# Patient Record
Sex: Female | Born: 1985 | Race: White | Hispanic: No | Marital: Married | Smoking: Never smoker
Health system: Southern US, Community
[De-identification: ages and names within clinical notes are randomized; demographics above are authoritative.]

## PROBLEM LIST (undated history)

## (undated) DIAGNOSIS — F419 Anxiety disorder, unspecified: Secondary | ICD-10-CM

## (undated) HISTORY — PX: WISDOM TOOTH EXTRACTION: SHX21

## (undated) HISTORY — DX: Anxiety disorder, unspecified: F41.9

---
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
## 2004-01-06 ENCOUNTER — Other Ambulatory Visit: Admission: RE | Admit: 2004-01-06 | Discharge: 2004-01-06 | Payer: Self-pay

## 2005-05-16 ENCOUNTER — Ambulatory Visit: Payer: Self-pay | Admitting: Family Medicine

## 2005-06-28 ENCOUNTER — Ambulatory Visit: Payer: Self-pay | Admitting: Family Medicine

## 2005-07-26 ENCOUNTER — Other Ambulatory Visit: Admission: RE | Admit: 2005-07-26 | Discharge: 2005-07-26 | Payer: Self-pay | Admitting: Obstetrics and Gynecology

## 2005-10-14 ENCOUNTER — Ambulatory Visit (HOSPITAL_COMMUNITY): Admission: RE | Admit: 2005-10-14 | Discharge: 2005-10-14 | Payer: Self-pay | Admitting: Obstetrics and Gynecology

## 2006-01-20 ENCOUNTER — Ambulatory Visit: Payer: Self-pay | Admitting: Family Medicine

## 2006-03-06 ENCOUNTER — Inpatient Hospital Stay (HOSPITAL_COMMUNITY): Admission: AD | Admit: 2006-03-06 | Discharge: 2006-03-10 | Payer: Self-pay | Admitting: Obstetrics and Gynecology

## 2006-12-20 IMAGING — US US OB COMP +14 WK
1 series · 13 of 28 positions shown · non-contrast
Comparison: none

CLINICAL DATA: Evaluate anatomy.

[Series 1: us ob comp +14 wk · 0.37mm/px · 13 of 57 slices shown]
[im 3/57]
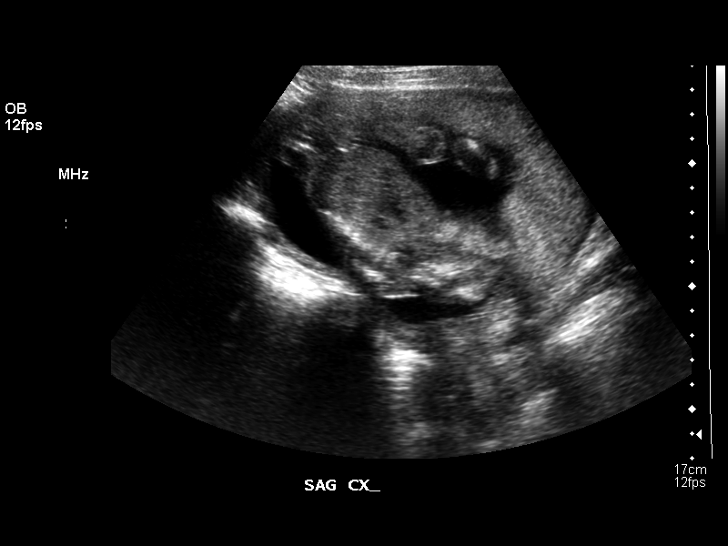
[im 7/57]
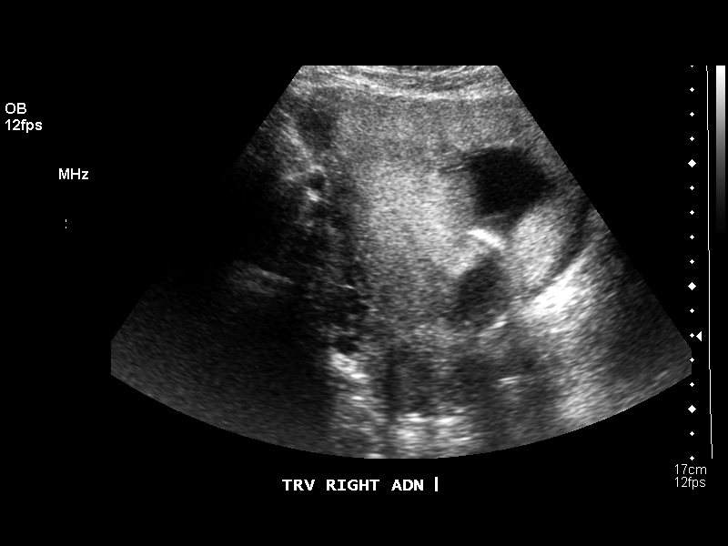
[im 11/57]
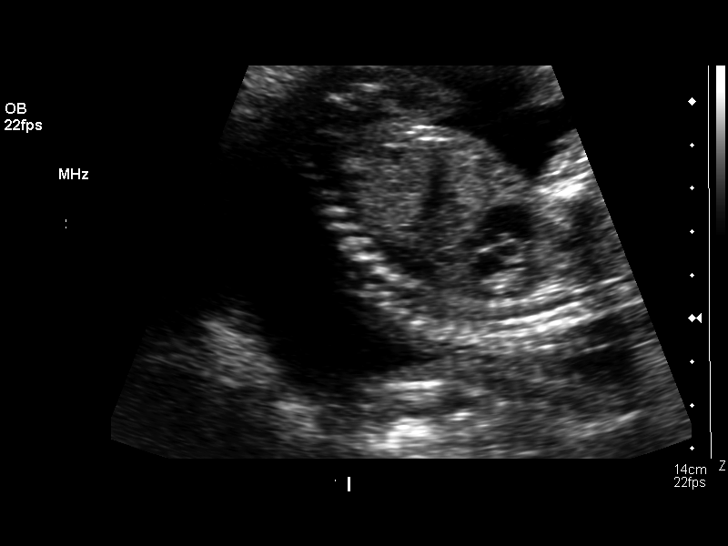
[im 15/57]
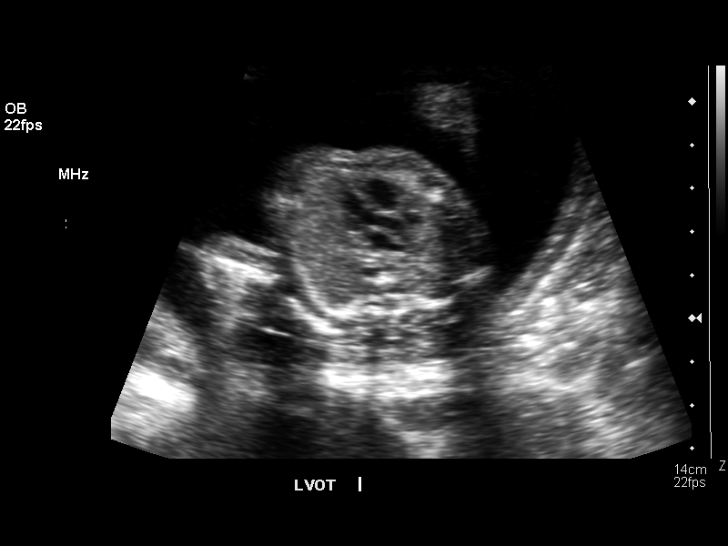
[im 19/57]
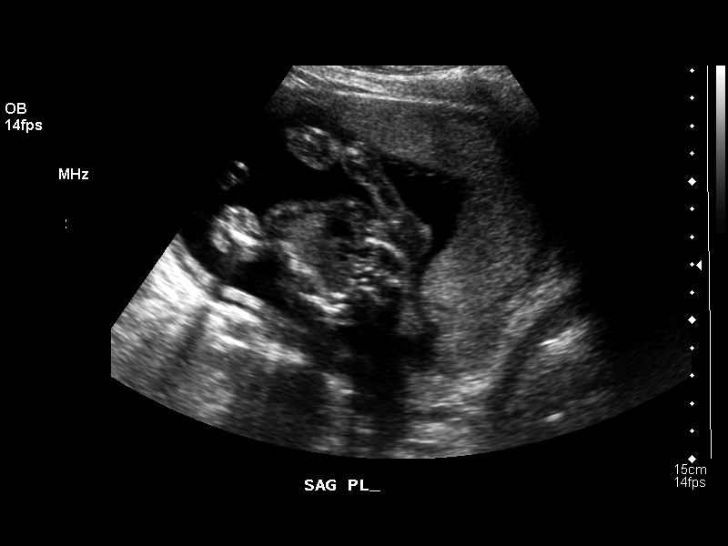
[im 23/57]
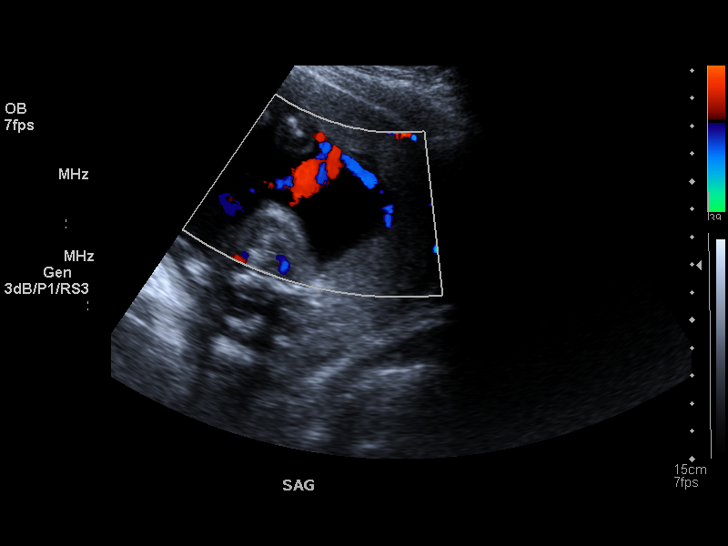
[im 30/57]
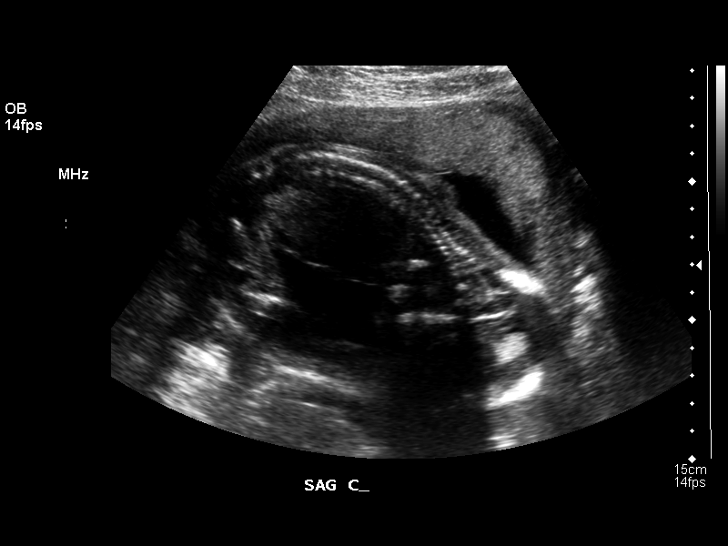
[im 34/57]
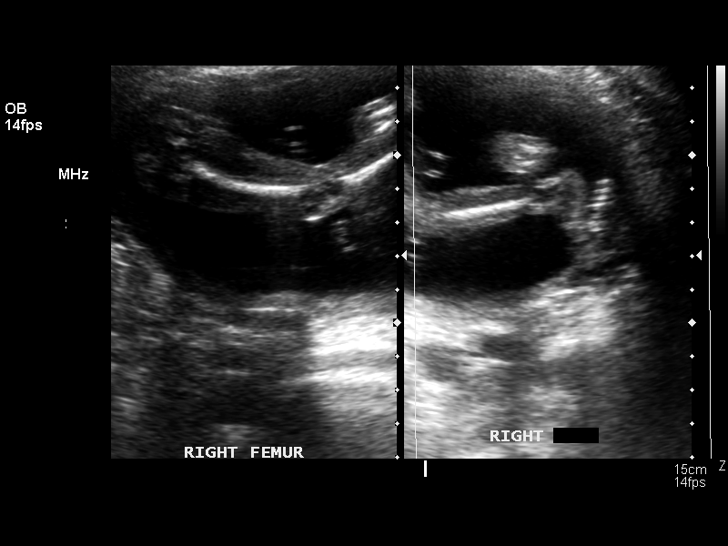
[im 38/57]
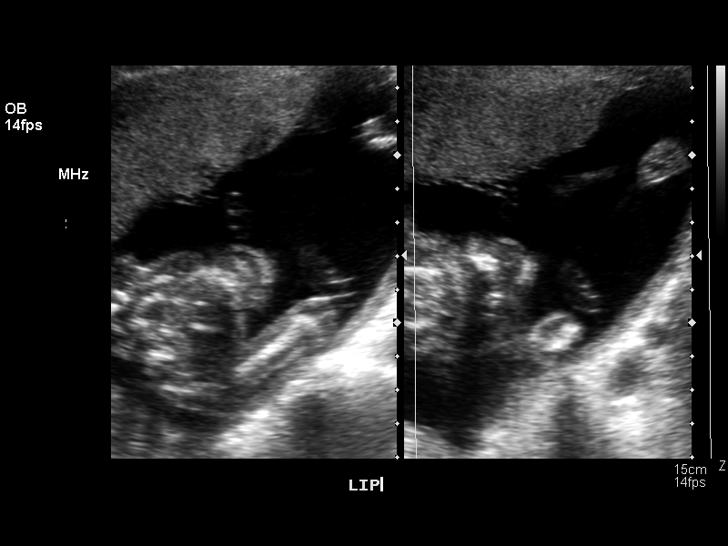
[im 42/57]
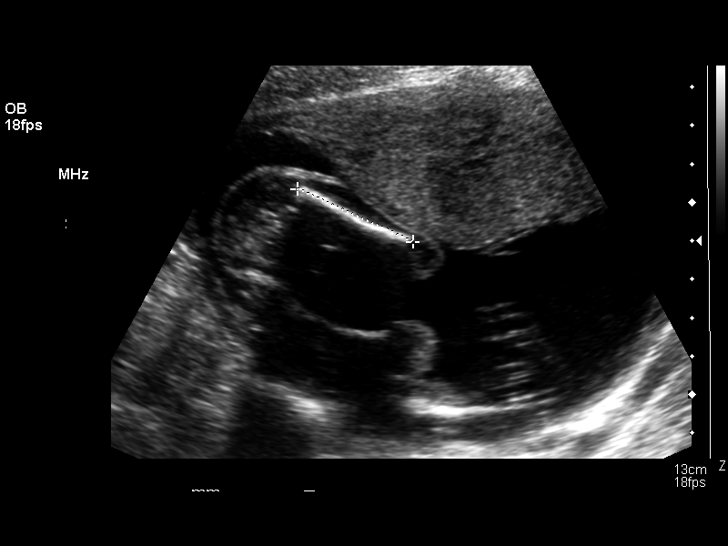
[im 46/57]
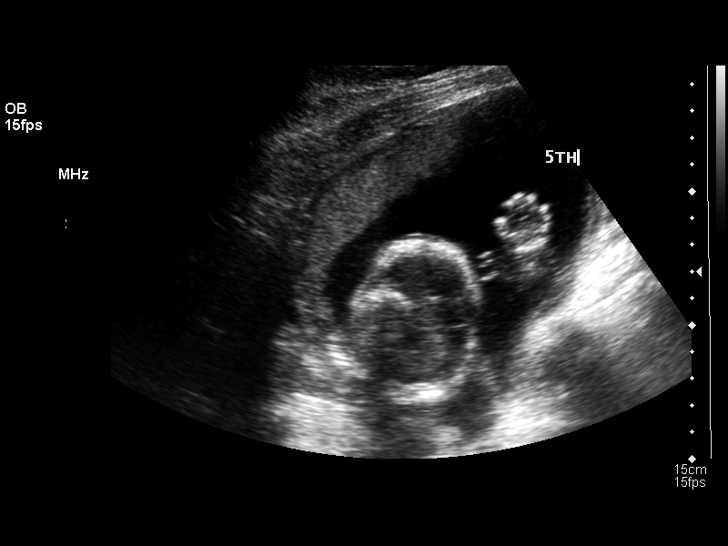
[im 50/57]
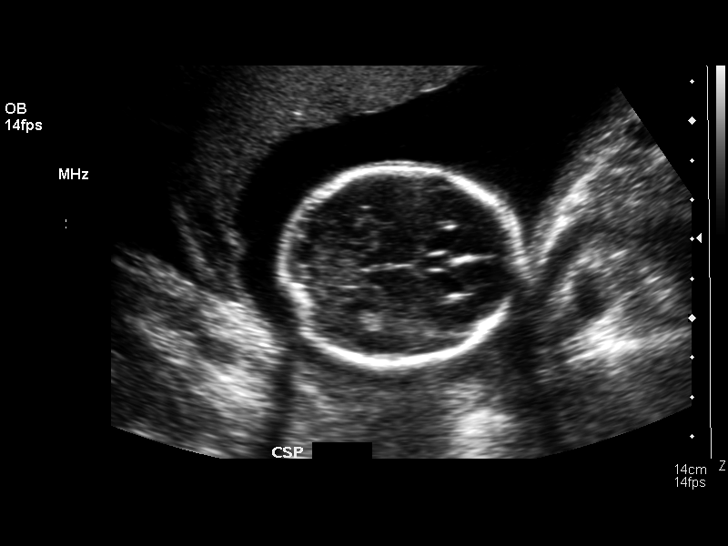
[im 54/57]
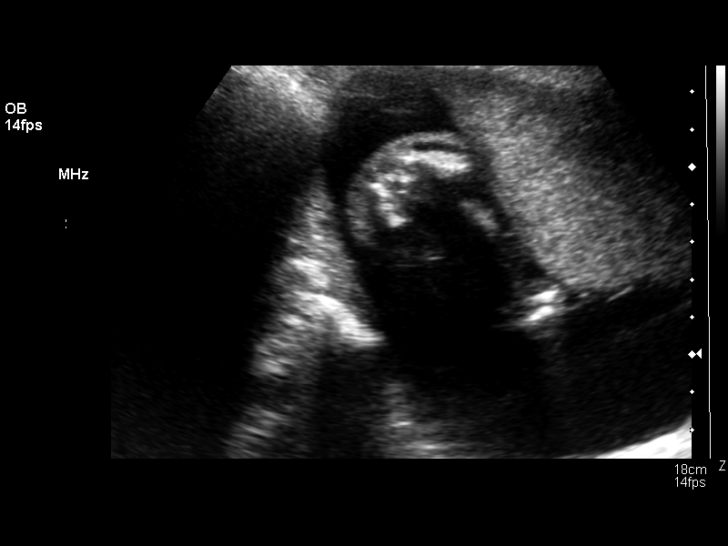

[13 of 28 positions shown; findings below may reference images not displayed]

OBSTETRICAL ULTRASOUND:
 Number of Fetuses:  1
 Heart Rate:  144
 Movement:  Yes
 Breathing:  No  
 Presentation:  Cephalic
 Placental Location:  Anterior
 Grade:  0
 Previa:  No
 Amniotic Fluid (Subjective):  Normal
 Amniotic Fluid (Objective):   4.5 cm Vertical pocket 

 FETAL BIOMETRY
 BPD:  4.8 cm   20 w 6 d   
 HC:   18.5 cm  20 w 6 d
 AC:   15.2 cm   20 w 3 d
 FL:    3.2 cm   20 w 1 d

 MEAN GA:  20 w 4 d
 Assigned GA:  20 w 1 d  Assigned EDC:  03/02/06    

 FETAL ANATOMY
 Lateral Ventricles:    Visualized 
 Thalami/CSP:      Visualized 
 Posterior Fossa:  Visualized 
 Nuchal Region:    Visualized 
 Spine:      Visualized 
 4 Chamber Heart on Left:      Visualized 
 Stomach on Left:      Visualized 
 3 Vessel Cord:    Visualized 
 Cord Insertion site:    Visualized 
 Kidneys:  Visualized 
 Bladder:  Visualized 
 Extremities:      Visualized 

 ADDITIONAL ANATOMY VISUALIZED:  LVOT, RVOT, upper lip, orbits, profile, diaphragm, heel, 5th digit, ductal arch, aortic arch, and male genitalia.

 MATERNAL UTERINE AND ADNEXAL FINDINGS
 Cervix: 3.3 cm Transabdominally.  Normal ovaries.
IMPRESSION: 1.  Single living intrauterine gestation at 20 week 4 day estimated gestational age which correlates well with the reported assigned gestational age.
 2.  Visualized fetal anatomy is unremarkable.

## 2016-01-18 ENCOUNTER — Encounter: Payer: Self-pay | Admitting: Physician Assistant

## 2016-12-19 ENCOUNTER — Encounter (INDEPENDENT_AMBULATORY_CARE_PROVIDER_SITE_OTHER): Payer: Self-pay

## 2016-12-19 ENCOUNTER — Encounter: Payer: Self-pay | Admitting: Physician Assistant

## 2016-12-19 ENCOUNTER — Ambulatory Visit (INDEPENDENT_AMBULATORY_CARE_PROVIDER_SITE_OTHER): Payer: PRIVATE HEALTH INSURANCE | Admitting: Physician Assistant

## 2016-12-19 VITALS — BP 109/75 | HR 73 | Temp 97.4°F | Ht 68.0 in | Wt 251.4 lb

## 2016-12-19 DIAGNOSIS — G43809 Other migraine, not intractable, without status migrainosus: Secondary | ICD-10-CM | POA: Diagnosis not present

## 2016-12-19 DIAGNOSIS — Z3041 Encounter for surveillance of contraceptive pills: Secondary | ICD-10-CM

## 2016-12-19 DIAGNOSIS — F325 Major depressive disorder, single episode, in full remission: Secondary | ICD-10-CM | POA: Diagnosis not present

## 2016-12-19 DIAGNOSIS — G43909 Migraine, unspecified, not intractable, without status migrainosus: Secondary | ICD-10-CM | POA: Insufficient documentation

## 2016-12-19 DIAGNOSIS — IMO0001 Reserved for inherently not codable concepts without codable children: Secondary | ICD-10-CM | POA: Insufficient documentation

## 2016-12-19 DIAGNOSIS — G5702 Lesion of sciatic nerve, left lower limb: Secondary | ICD-10-CM | POA: Insufficient documentation

## 2016-12-19 MED ORDER — SERTRALINE HCL 50 MG PO TABS: 50.0000 mg | ORAL_TABLET | Freq: Every day | ORAL | 11 refills | Status: DC

## 2016-12-19 MED ORDER — SUMATRIPTAN SUCCINATE 100 MG PO TABS: 100.0000 mg | ORAL_TABLET | ORAL | 2 refills | Status: DC | PRN

## 2016-12-19 MED ORDER — DESOGESTREL-ETHINYL ESTRADIOL 0.15-0.02/0.01 MG (21/5) PO TABS: 1.0000 | ORAL_TABLET | Freq: Every day | ORAL | 6 refills | Status: DC

## 2016-12-19 NOTE — Patient Instructions (Signed)
Piriformis Syndrome  Piriformis syndrome is a condition that can cause pain and numbness in your buttocks and down the back of your leg. Piriformis syndrome happens when the small muscle that connects the base of your spine to your hip (piriformis muscle) presses on the nerve that runs down the back of your leg (sciatic nerve).  The piriformis muscle helps your hip rotate and helps to bring your leg back and out. It also helps shift your weight while you are walking to keep you stable. The sciatic nerve runs under or through the piriformis. Damage to the piriformis muscle can cause spasms that put pressure on the nerve below. This causes pain and discomfort while sitting and moving. The pain may feel as if it begins in the buttock and spreads (radiates) down your hip and thigh.  What are the causes?  This condition is caused by pressure on the sciatic nerve from the piriformis muscle. The piriformis muscle can get irritated with overuse, especially if other hip muscles are weak and the piriformis has to do extra work. Piriformis syndrome can also occur after an injury, like a fall onto your buttocks.  What increases the risk?  This condition is more likely to develop in:  · Women.  · People who sit for long periods of time.  · Cyclists.  · People who have weak buttocks muscles (gluteal muscles).    What are the signs or symptoms?  Pain, tingling, or numbness that starts in the buttock and runs down the back of your leg (sciatica) is the most common symptom of this condition. Your symptoms may:  · Get worse the longer you sit.  · Get worse when you walk, run, or go up on stairs.    How is this diagnosed?  This condition is diagnosed based on your symptoms, medical history, and physical exam. During this exam, your health care provider may move your leg into different positions to check for pain. He or she will also press on the muscles of your hip and buttock to see if that increases your symptoms. You may also have  an X-ray or MRI.  How is this treated?  Treatment for this condition may include:  · Stopping all activities that cause pain or make your condition worse.  · Using heat or ice to relieve pain as told by your health care provider.  · Taking medicines to reduce pain and swelling.  · Taking a muscle relaxer to release the piriformis muscle.  · Doing range-of-motion and strengthening exercises (physical therapy) as told by your health care provider.  · Massaging the affected area.  · Getting an injection of an anti-inflammatory medicine or muscle relaxer to reduce inflammation and muscle tension.    In rare cases, you may need surgery to cut the muscle and release pressure on the nerve if other treatments do not work.  Follow these instructions at home:  · Take over-the-counter and prescription medicines only as told by your health care provider.  · Do not sit for long periods. Get up and walk around every 20 minutes or as often as told by your health care provider.  · If directed, apply heat to the affected area as often as told by your health care provider. Use the heat source that your health care provider recommends, such as a moist heat pack or a heating pad.  ? Place a towel between your skin and the heat source.  ? Leave the heat on for 20-30 minutes.  ?   Remove the heat if your skin turns bright red. This is especially important if you are unable to feel pain, heat, or cold. You may have a greater risk of getting burned.  · If directed, apply ice to the injured area.  ? Put ice in a plastic bag.  ? Place a towel between your skin and the bag.  ? Leave the ice on for 20 minutes, 2-3 times a day.  · Do exercises as told by your health care provider.  · Return to your normal activities as told by your health care provider. Ask your health care provider what activities are safe for you.  · Keep all follow-up visits as told by your health care provider. This is important.  How is this prevented?  · Do not sit for  longer than 20 minutes at a time. When you sit, choose padded surfaces.  · Warm up and stretch before being active.  · Cool down and stretch after being active.  · Give your body time to rest between periods of activity.  · Make sure to use equipment that fits you.  · Maintain physical fitness, including:  ? Strength.  ? Flexibility.  Contact a health care provider if:  · Your pain and stiffness continue or get worse.  · Your leg or hip becomes weak.  · You have changes in your bowel function or bladder function.  This information is not intended to replace advice given to you by your health care provider. Make sure you discuss any questions you have with your health care provider.  Document Released: 10/17/2005 Document Revised: 06/21/2016 Document Reviewed: 09/29/2015  Elsevier Interactive Patient Education © 2017 Elsevier Inc.

## 2016-12-19 NOTE — Progress Notes (Signed)
BP 109/75   Pulse 73   Temp 97.4 F (36.3 C) (Oral)   Ht 5\' 8"  (1.727 m)   Wt 251 lb 6.4 oz (114 kg)   LMP 12/05/2016   BMI 38.23 kg/m    Subjective:    Patient ID: Michaela Campbell, female    DOB: 06/15/1986, 31 y.o.   MRN: [id number]  Michaela Campbell is a 31 y.o. female presenting on 12/19/2016 for Follow-up  HPI Patient here to be [facility].  This patient is known to me from [facility]. This patient comes in for periodic recheck on medications and conditions including migraines, depression and birth control.   All medications are reviewed today. There are no reports of any problems with the medications. All of the medical conditions are reviewed and updated.  Lab work is reviewed and will be ordered as medically necessary. Still some old complaints of left hip pain. 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   Past Medical History:  Diagnosis Date  . Anxiety    Relevant past medical, surgical, family and social history reviewed and updated as indicated. Interim medical history since our last visit reviewed. Allergies and medications reviewed and updated.   Data reviewed from any sources in EPIC.  Review of Systems  Constitutional: Negative.  Negative for activity change, fatigue and fever.  HENT: Negative.   Eyes: Negative.   Respiratory: Negative.  Negative for cough.   Cardiovascular: Negative.  Negative for chest pain.  Gastrointestinal: Negative.  Negative for abdominal pain.  Endocrine: Negative.   Genitourinary: Negative.  Negative for dysuria.  Musculoskeletal: Positive for arthralgias. Negative for joint swelling.  Skin: Negative.   Neurological: Negative.      Social History   Social History  . Marital status: Married    Spouse name: N/A  . Number of children: N/A  . Years of education: N/A   Occupational History  . Not on  file.   Social History Main Topics  . Smoking status: Never Smoker  . Smokeless tobacco: Never Used  . Alcohol use Yes     Comment: occ  . Drug use: No  . Sexual activity: Not on file   Other Topics Concern  . Not on file   Social History Narrative  . No narrative on file    Past Surgical History:  Procedure Laterality Date  . WISDOM TOOTH EXTRACTION      History reviewed. No pertinent family history.  Allergies as of 12/19/2016   No Known Allergies     Medication List       Accurate as of 12/19/16  7:45 PM. Always use your most recent med list.          desogestrel-ethinyl estradiol 0.15-0.02/0.01 MG (21/5) tablet Commonly known as:  KARIVA,AZURETTE,MIRCETTE Take 1 tablet by mouth daily.   sertraline 50 MG tablet Commonly known as:  ZOLOFT Take 1 tablet (50 mg total) by mouth daily.   SUMAtriptan 100 MG tablet Commonly known as:  IMITREX Take 1 tablet (100 mg total) by mouth every 2 (two) hours as needed for migraine. May repeat in 2 hours if headache persists or recurs.          Objective:    BP 109/75   Pulse 73   Temp 97.4 F (36.3 C) (Oral)   Ht 5\' 8"  (1.727 m)   Wt 251  lb 6.4 oz (114 kg)   LMP 12/05/2016   BMI 38.23 kg/m   No Known Allergies Wt Readings from Last 3 Encounters:  12/19/16 251 lb 6.4 oz (114 kg)    Physical Exam  Constitutional: She is oriented to person, place, and time. She appears well-developed and well-nourished.  HENT:  Head: Normocephalic and atraumatic.  Right Ear: Tympanic membrane, external ear and ear canal normal.  Left Ear: Tympanic membrane, external ear and ear canal normal.  Nose: Nose normal. No rhinorrhea.  Mouth/Throat: Oropharynx is clear and moist and mucous membranes are normal. No oropharyngeal exudate or posterior oropharyngeal erythema.  Eyes: Conjunctivae and EOM are normal. Pupils are equal, round, and reactive to light.  Neck: Normal range of motion. Neck supple.  Cardiovascular: Normal rate,  regular rhythm, normal heart sounds and intact distal pulses.   Pulmonary/Chest: Effort normal and breath sounds normal.  Abdominal: Soft. Bowel sounds are normal.  Neurological: She is alert and oriented to person, place, and time. She has normal reflexes.  Skin: Skin is warm and dry. No rash noted.  Psychiatric: She has a normal mood and affect. Her behavior is normal. Judgment and thought content normal.    No results found for this or any previous visit.    Assessment & Plan:   1. Piriformis syndrome of left side  2. Other migraine without status migrainosus, not intractable - SUMAtriptan (IMITREX) 100 MG tablet; Take 1 tablet (100 mg total) by mouth every 2 (two) hours as needed for migraine. May repeat in 2 hours if headache persists or recurs.  Dispense: 10 tablet; Refill: 2  3. Encounter for surveillance of contraceptive pills - desogestrel-ethinyl estradiol (KARIVA,AZURETTE,MIRCETTE) 0.15-0.02/0.01 MG (21/5) tablet; Take 1 tablet by mouth daily.  Dispense: 1 Package; Refill: 6  4. Depression, major, single episode, complete remission (HCC) - sertraline (ZOLOFT) 50 MG tablet; Take 1 tablet (50 mg total) by mouth daily.  Dispense: 30 tablet; Refill: 11   Continue all other maintenance medications as listed above. Educational handout given for piriformis stretches  Follow up plan: Return in about 6 months (around 06/18/2017) for CPE/PAP.  Remus Loffler PA-C [facility] [address]  [place], [place] [address] [phone]   12/19/2016, 7:45 PM

## 2017-04-06 ENCOUNTER — Encounter: Payer: Self-pay | Admitting: Physician Assistant

## 2017-04-06 ENCOUNTER — Ambulatory Visit (INDEPENDENT_AMBULATORY_CARE_PROVIDER_SITE_OTHER): Payer: PRIVATE HEALTH INSURANCE | Admitting: Physician Assistant

## 2017-04-06 VITALS — BP 116/76 | HR 75 | Temp 97.3°F | Ht 68.0 in | Wt 260.0 lb

## 2017-04-06 DIAGNOSIS — M25561 Pain in right knee: Secondary | ICD-10-CM

## 2017-04-06 MED ORDER — MELOXICAM 15 MG PO TABS: 15.0000 mg | ORAL_TABLET | Freq: Every day | ORAL | 0 refills | Status: DC

## 2017-04-06 NOTE — Progress Notes (Signed)
Subjective:     Patient ID: Michaela Campbell, female   DOB: [birth year], 31 y.o.   MRN: [id number]  HPI Pt with 1 week hx of pain and swelling to the R knee Hx of same in the past and [facility] Denies any locking or giving way of the knee Using 2 OTC NSAIDS per day Sx worse going up and down steps/hills Review of Systems  Constitutional: Positive for activity change.  Musculoskeletal: Positive for joint swelling.       Objective:   Physical Exam NAD + effusion of the R knee FROM of the knee w/o crepitus No laxity noted + TTP of the lateral joint Lachman neg McMurray neg Good strength    Assessment:     Acute pain of right knee      Plan:     Heat/Ice Mobic 15mg  q d x 10 days OTC brace Hold any OTC NSAIDS F/U if sx continue

## 2017-04-06 NOTE — Patient Instructions (Addendum)
Knee Pain, Adult Knee pain in adults is common. It can be caused by many things, including: Arthritis. A fluid-filled sac (cyst) or growth in your knee. An infection in your knee. An injury that will not heal. Damage, swelling, or irritation of the tissues that support your knee.  Knee pain is usually not a sign of a serious problem. The pain may go away on its own with time and rest. If it does not, a health care provider may order tests to find the cause of the pain. These may include: Imaging tests, such as an X-ray, MRI, or ultrasound. Joint aspiration. In this test, fluid is removed from the knee. Arthroscopy. In this test, a lighted tube is inserted into knee and an image is projected onto a TV screen. A biopsy. In this test, a sample of tissue is removed from the body and studied under a microscope.  Follow these instructions at home: Pay attention to any changes in your symptoms. Take these actions to relieve your pain. Activity Rest your knee. Do not do things that cause pain or make pain worse. Avoid high-impact activities or exercises, such as running, jumping rope, or doing jumping jacks. General instructions Take over-the-counter and prescription medicines only as told by your health care provider. Raise (elevate) your knee above the level of your heart when you are sitting or lying down. Sleep with a pillow under your knee. If directed, apply ice to the knee: Put ice in a plastic bag. Place a towel between your skin and the bag. Leave the ice on for 20 minutes, 2-3 times a day. Ask your health care provider if you should wear an elastic knee support. Lose weight if you are overweight. Extra weight can put pressure on your knee. Do not use any products that contain nicotine or tobacco, such as cigarettes and e-cigarettes. Smoking may slow the healing of any bone and joint problems that you may have. If you need help quitting, ask your health care provider. Contact a health  care provider if: Your knee pain continues, changes, or gets worse. You have a fever along with knee pain. Your knee buckles or locks up. Your knee swells, and the swelling becomes worse. Get help right away if: Your knee feels warm to the touch. You cannot move your knee. You have severe pain in your knee. You have chest pain. You have trouble breathing. Summary Knee pain in adults is common. It can be caused by many things, including, arthritis, infection, cysts, or injury. Knee pain is usually not a sign of a serious problem, but if it does not go away, a health care provider may perform tests to know the cause of the pain. Pay attention to any changes in your symptoms. Relieve your pain with rest, medicines, light activity, and use of ice. Get help if your pain continues or becomes very severe, or if your knee buckles or locks up, or if you have chest pain or trouble breathing. This information is not intended to replace advice given to you by your health care provider. Make sure you discuss any questions you have with your health care provider. Document Released: 08/14/2007 Document Revised: 10/07/2016 Document Reviewed: 10/07/2016 Elsevier Interactive Patient Education  2018 Elsevier Inc. Knee Pain, Adult Many things can cause knee pain. The pain often goes away on its own with time and rest. If the pain does not go away, tests may be done to find out what is causing the pain. Follow these instructions at  home: Activity Rest your knee. Do not do things that cause pain. Avoid activities where both feet leave the ground at the same time (high-impact activities). Examples are running, jumping rope, and doing jumping jacks. General instructions Take medicines only as told by your doctor. Raise (elevate) your knee when you are resting. Make sure your knee is higher than your heart. Sleep with a pillow under your knee. If told, put ice on the knee: Put ice in a plastic bag. Place a  towel between your skin and the bag. Leave the ice on for 20 minutes, 2-3 times a day. Ask your doctor if you should wear an elastic knee support. Lose weight if you are overweight. Being overweight can make your knee hurt more. Do not use any tobacco products. These include cigarettes, chewing tobacco, or electronic cigarettes. If you need help quitting, ask your doctor. Smoking may slow down healing. Contact a doctor if: The pain does not stop. The pain changes or gets worse. You have a fever along with knee pain. Your knee gives out or locks up. Your knee swells, and becomes worse. Get help right away if: Your knee feels warm. You cannot move your knee. You have very bad knee pain. You have chest pain. You have trouble breathing. Summary Many things can cause knee pain. The pain often goes away on its own with time and rest. Avoid activities that put stress on your knee. These include running and jumping rope. Get help right away if you cannot move your knee, or if your knee feels warm, or if you have trouble breathing. This information is not intended to replace advice given to you by your health care provider. Make sure you discuss any questions you have with your health care provider. Document Released: 01/13/2009 Document Revised: 10/11/2016 Document Reviewed: 10/11/2016 Elsevier Interactive Patient Education  2017 Elsevier Inc. Hoffa Disease Hoffa disease is a condition that causes pain in front of the knee, below the kneecap. The pain is caused by pinching (impingement) ofa swollen or scarred mass of fatty tissue (fat pad) below your kneecap. There are two kinds of Hoffa disease:  Acute Hoffa disease. This kind develops due to a recent injury.  Chronic Hoffa disease. This kind develops over time.  Hoffa disease is also called infrapatellar fat pad impingement. What are the causes? Acute Hoffa disease is caused by an injury to the fat pad below your kneecap. It can happen  if:  You are hit under your kneecap.  You fall on your knee.  You forcefully straighten your knee more than normal (hyperextend your knee).  Chronic Hoffa disease can be caused by repeated minor injuries, such as:  Repeatedly kneeling or falling on a hard surface.  Repeatedly hyperextending your knee.  What increases the risk? This condition is more likely to develop in people who play:  Contact sports, such as hockey and football.  Sports that involve forceful extending of the knee, such as football and soccer.  What are the signs or symptoms? The main symptom of this condition is pain in the front of your knee. The pain may:  Be burning or aching.  Get worse when you straighten your knee, squat, or stand for long periods of time.  Be felt when you touch or move the kneecap (patella).  There may also be swelling below the patella. How is this diagnosed? This condition may be diagnosed with:  Your medical history.  A physical exam. The exam may include straightening your knee  with pressure near the fat pad and moving your knee in different directions.  MRI. This may be done to look for swelling or changes to the fat pad.  How is this treated? This condition may be treated by:  Resting your knee until swelling and pain improve.  Icing your knee.  Taking over-the-counter pain medicine to reduce pain and swelling.  Wrapping or taping your knee.  Injecting the fat pad with numbing and anti-inflammatory medicine (steroid).  Doing knee exercises.  Avoiding putting direct pressure on your knees, such as by kneeling.  Staying aware of the way you walk and stand so your knee does not hyperextend.  If these treatments are not helpful, you may need to have surgery. Surgery usually includes taking out the part of the fat pad that is causing pain. Follow these instructions at home: If your knee is wrapped or taped:  Wear the wrap or tape as told by your health care  provider.  Loosen the wrap or tape if your toes tingle, become numb, or turn cold and blue.  Remove the wrap or tape only as told by your health care provider. Managing pain, stiffness, and swelling  If directed, apply ice to your knee. ? Put ice in a plastic bag. ? Place a towel between your skin and the bag. ? Leave the ice on for 20 minutes, 2-3 times a day.  Move your toes often to avoid stiffness and to lessen swelling.  Raise (elevate) your knee above the level of your heart while you are sitting or lying down. Activity  Return to your normal activities as told by your health care provider. Ask your health care provider what activities are safe for you.  Avoiding putting direct pressure on your knees, such as by kneeling, for as long as told by your health care provider.  Do exercises as told by your health care provider. General instructions  Take over-the-counter and prescription medicines only as told by your health care provider.  Keep all follow-up visits as told by your health care provider. This is important. How is this prevented?  Warm up and stretch before being active.  Cool down and stretch after being active.  Give your body time to rest between periods of activity.  Make sure to use equipment that fits you.  When playing contact sports, wear pads that protect your knees.  Be safe and responsible while being active to avoid falls.  Do at least 150 minutes of moderate-intensity exercise each week, such as brisk walking or water aerobics.  Maintain physical fitness, including: ? Strength. ? Flexibility. ? Cardiovascular fitness. ? Endurance. Contact a health care provider if:  Your knee pain or swelling is not improving after 2 weeks of home treatment.  Your knee pain or swelling is getting worse. This information is not intended to replace advice given to you by your health care provider. Make sure you discuss any questions you have with your  health care provider. Document Released: 10/17/2005 Document Revised: 06/23/2016 Document Reviewed: 08/29/2015 Elsevier Interactive Patient Education  2018 [employer]

## 2017-06-19 ENCOUNTER — Other Ambulatory Visit: Payer: PRIVATE HEALTH INSURANCE | Admitting: Physician Assistant

## 2017-06-27 ENCOUNTER — Ambulatory Visit (INDEPENDENT_AMBULATORY_CARE_PROVIDER_SITE_OTHER): Payer: PRIVATE HEALTH INSURANCE | Admitting: Physician Assistant

## 2017-06-27 ENCOUNTER — Encounter: Payer: Self-pay | Admitting: Physician Assistant

## 2017-06-27 VITALS — BP 121/64 | HR 85 | Temp 98.3°F | Ht 68.0 in | Wt 274.0 lb

## 2017-06-27 DIAGNOSIS — Z3041 Encounter for surveillance of contraceptive pills: Secondary | ICD-10-CM

## 2017-06-27 DIAGNOSIS — Z01419 Encounter for gynecological examination (general) (routine) without abnormal findings: Secondary | ICD-10-CM | POA: Diagnosis not present

## 2017-06-27 DIAGNOSIS — R635 Abnormal weight gain: Secondary | ICD-10-CM

## 2017-06-27 LAB — URINALYSIS, COMPLETE
Bilirubin, UA: NEGATIVE
Glucose, UA: NEGATIVE
LEUKOCYTES UA: NEGATIVE
Nitrite, UA: NEGATIVE
PROTEIN UA: NEGATIVE
SPEC GRAV UA: 1.025 (ref 1.005–1.030)
Urobilinogen, Ur: 0.2 mg/dL (ref 0.2–1.0)
pH, UA: 7 (ref 5.0–7.5)

## 2017-06-27 LAB — MICROSCOPIC EXAMINATION
Renal Epithel, UA: NONE SEEN /hpf
WBC, UA: NONE SEEN /hpf (ref 0–?)

## 2017-06-27 MED ORDER — DESOGESTREL-ETHINYL ESTRADIOL 0.15-0.02/0.01 MG (21/5) PO TABS: 1.0000 | ORAL_TABLET | Freq: Every day | ORAL | 12 refills | Status: DC

## 2017-06-27 MED ORDER — PHENTERMINE HCL 37.5 MG PO CAPS: 37.5000 mg | ORAL_CAPSULE | ORAL | 1 refills | Status: DC

## 2017-06-27 NOTE — Patient Instructions (Signed)
Breakfast: eggs 2-3 Or greek yogurt low fat DANNON 1 slice delightful [employer] bread  Lunch: 2 slice [employer] delightfully bread       Or Nature's Own Light 4 ounces chicken, turkey, roast beef 1 slice Thin sliced cheese Sargento Mustard ok 1 piece of fruit  Supper: 6 ounces lean meat 2 cups raw/cooked veg or 1 cup pintos, corn lima  3 snacks 100 calories or less  

## 2017-06-27 NOTE — Progress Notes (Signed)
BP 121/64   Pulse 85   Temp 98.3 F (36.8 C) (Oral)   Ht 5\' 8"  (1.727 m)   Wt 274 lb (124.3 kg)   BMI 41.66 kg/m    Subjective:    Patient ID: Michaela Campbell, female    DOB: [birth year], 31 y.o.   MRN: [id number]  HPI: Michaela Campbell is a 31 y.o. female presenting on 06/27/2017 for Annual Exam  This patient comes in for annual well physical examination. All medications are reviewed today. There are no reports of any problems with the medications. All of the medical conditions are reviewed and updated.  Lab work is reviewed and will be ordered as medically necessary. There are no new problems reported with today's visit.  Patient reports doing well overall.  The patient does have a more sedentary job now. 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 She has gained about 20 pounds. She is taken phentermine in the past. She would like to start this back. We have talked about a higher protein lower carb type diet around 1500 cal that information has been given to her. And we will recheck her in 2 months.  Relevant past medical, surgical, family and social history reviewed and updated as indicated. Allergies and medications reviewed and updated.  Past Medical History:  Diagnosis Date  . Anxiety     Past Surgical History:  Procedure Laterality Date  . WISDOM TOOTH EXTRACTION      Review of Systems  Constitutional: Positive for unexpected weight change. Negative for activity change, fatigue and fever.  HENT: Negative.   Eyes: Negative.   Respiratory: Negative.  Negative for cough and wheezing.   Cardiovascular: Negative.  Negative for chest pain, palpitations and leg swelling.  Gastrointestinal: Negative.  Negative for abdominal pain.  Endocrine: Negative.   Genitourinary: Negative.  Negative for dysuria.  Musculoskeletal: Negative.   Skin: Negative.   Neurological: Negative.     Allergies as of 06/27/2017   No Known Allergies     Medication List         Accurate as of 06/27/17  4:43 PM. Always use your most recent med list.          desogestrel-ethinyl estradiol 0.15-0.02/0.01 MG (21/5) tablet Commonly known as:  KARIVA,AZURETTE,MIRCETTE Take 1 tablet by mouth daily.   ibuprofen 200 MG tablet Commonly known as:  ADVIL,MOTRIN Take 200 mg by mouth every 6 (six) hours as needed.   phentermine 37.5 MG capsule Take 1 capsule (37.5 mg total) by mouth every morning.   sertraline 50 MG tablet Commonly known as:  ZOLOFT Take 1 tablet (50 mg total) by mouth daily.   SUMAtriptan 100 MG tablet Commonly known as:  IMITREX Take 1 tablet (100 mg total) by mouth every 2 (two) hours as needed for migraine. May repeat in 2 hours if headache persists or recurs.            Discharge Care Instructions        Start     Ordered   06/27/17 0000  phentermine 37.5 MG capsule  BH-each morning    Question:  Supervising Provider  Answer:  Elenora Gamma   06/27/17 1614   06/27/17 0000  desogestrel-ethinyl estradiol (KARIVA,AZURETTE,MIRCETTE) 0.15-0.02/0.01 MG (21/5) tablet  Daily    Question:  Supervising Provider  Answer:  Elenora Gamma   06/27/17 1615   06/27/17 0000  Pap IG w/ reflex to HPV when ASC-U    Question:  Number of Slides/Vials:  Answer:  1   06/27/17 1625   06/27/17 0000  Urinalysis, Complete     06/27/17 1626         Objective:    BP 121/64   Pulse 85   Temp 98.3 F (36.8 C) (Oral)   Ht 5\' 8"  (1.727 m)   Wt 274 lb (124.3 kg)   BMI 41.66 kg/m   No Known Allergies  Physical Exam  Constitutional: She is oriented to person, place, and time. She appears well-developed and well-nourished.  HENT:  Head: Normocephalic and atraumatic.  Eyes: Pupils are equal, round, and reactive to light. Conjunctivae and EOM are normal.  Neck: Normal range of motion. Neck supple.  Cardiovascular: Normal rate, regular rhythm, normal heart sounds and intact distal pulses.   Pulmonary/Chest: Effort normal and breath sounds  normal. Right breast exhibits no mass, no skin change and no tenderness. Left breast exhibits no mass, no skin change and no tenderness. Breasts are symmetrical.  Abdominal: Soft. Bowel sounds are normal.  Genitourinary: Vagina normal and uterus normal. Rectal exam shows no fissure. No breast swelling, tenderness, discharge or bleeding. There is no tenderness or lesion on the right labia. There is no tenderness or lesion on the left labia. Uterus is not deviated, not enlarged and not tender. Cervix exhibits no motion tenderness, no discharge and no friability. Right adnexum displays no mass, no tenderness and no fullness. Left adnexum displays no mass, no tenderness and no fullness. No tenderness or bleeding in the vagina. No vaginal discharge found.  Neurological: She is alert and oriented to person, place, and time. She has normal reflexes.  Skin: Skin is warm and dry. No rash noted.  Psychiatric: She has a normal mood and affect. Her behavior is normal. Judgment and thought content normal.  Nursing note and vitals reviewed.   No results found for this or any previous visit.    Assessment & Plan:   1. Well female exam with routine gynecological exam - Pap IG w/ reflex to HPV when ASC-U - Urinalysis, Complete  2. Encounter for surveillance of contraceptive pills - desogestrel-ethinyl estradiol (KARIVA,AZURETTE,MIRCETTE) 0.15-0.02/0.01 MG (21/5) tablet; Take 1 tablet by mouth daily.  Dispense: 1 Package; Refill: 12  3. Weight gain - phentermine 37.5 MG capsule; Take 1 capsule (37.5 mg total) by mouth every morning.  Dispense: 30 capsule; Refill: 1    Current Outpatient Prescriptions:  .  desogestrel-ethinyl estradiol (KARIVA,AZURETTE,MIRCETTE) 0.15-0.02/0.01 MG (21/5) tablet, Take 1 tablet by mouth daily., Disp: 1 Package, Rfl: 12 .  ibuprofen (ADVIL,MOTRIN) 200 MG tablet, Take 200 mg by mouth every 6 (six) hours as needed., Disp: , Rfl:  .  sertraline (ZOLOFT) 50 MG tablet, Take 1 tablet  (50 mg total) by mouth daily., Disp: 30 tablet, Rfl: 11 .  SUMAtriptan (IMITREX) 100 MG tablet, Take 1 tablet (100 mg total) by mouth every 2 (two) hours as needed for migraine. May repeat in 2 hours if headache persists or recurs., Disp: 10 tablet, Rfl: 2 .  phentermine 37.5 MG capsule, Take 1 capsule (37.5 mg total) by mouth every morning., Disp: 30 capsule, Rfl: 1 Continue all other maintenance medications as listed above.  Follow up plan: Return in about 2 months (around 08/27/2017) for recheck weight.  Educational handout given for 1500 calorie diet  Remus Loffler PA-C [facility] [address]  [place], [place] [address] [phone]   06/27/2017, 4:43 PM

## 2017-06-28 LAB — PAP IG W/ RFLX HPV ASCU: PAP SMEAR COMMENT: 0

## 2017-08-28 ENCOUNTER — Encounter: Payer: Self-pay | Admitting: Physician Assistant

## 2017-08-28 ENCOUNTER — Ambulatory Visit (INDEPENDENT_AMBULATORY_CARE_PROVIDER_SITE_OTHER): Payer: PRIVATE HEALTH INSURANCE | Admitting: Physician Assistant

## 2017-08-28 DIAGNOSIS — R635 Abnormal weight gain: Secondary | ICD-10-CM

## 2017-08-28 MED ORDER — PHENTERMINE HCL 37.5 MG PO CAPS: 37.5000 mg | ORAL_CAPSULE | ORAL | 1 refills | Status: DC

## 2017-08-28 NOTE — Patient Instructions (Signed)
In a few days you may receive a survey in the mail or online from [employer] regarding your visit with us today. Please take a moment to fill this out. Your feedback is very important to our whole office. It can help us better understand your needs as well as improve your experience and satisfaction. Thank you for taking your time to complete it. We care about you.  Vang Kraeger, PA-C  

## 2017-08-28 NOTE — Progress Notes (Signed)
BP 108/74   Pulse 68   Temp (!) 97.2 F (36.2 C) (Oral)   Ht 5\' 8"  (1.727 m)   Wt 272 lb (123.4 kg)   BMI 41.36 kg/m    Subjective:    Patient ID: Michaela Campbell, female    DOB: 30-May-1986, 31 y.o.   MRN: [id number]  HPI: Michaela Campbell is a 31 y.o. female presenting on 08/28/2017 for No chief complaint on file. This patient comes in for 47-month recheck on her weight loss program.  She has lost 2 pounds.  She states that she was out of town training for about 3 weeks and had a hard time eating properly.  She does have a very high stress job.  Overall she is feeling good on the Adipex.  She would like to continue with it.  We will plan to recheck her in 2 months.  Relevant past medical, surgical, family and social history reviewed and updated as indicated. Allergies and medications reviewed and updated.  Past Medical History:  Diagnosis Date  . Anxiety     Past Surgical History:  Procedure Laterality Date  . WISDOM TOOTH EXTRACTION      Review of Systems  Constitutional: Negative.   HENT: Negative.   Eyes: Negative.   Respiratory: Negative.   Gastrointestinal: Negative.   Genitourinary: Negative.     Allergies as of 08/28/2017   No Known Allergies     Medication List       Accurate as of 08/28/17 11:10 AM. Always use your most recent med list.          desogestrel-ethinyl estradiol 0.15-0.02/0.01 MG (21/5) tablet Commonly known as:  KARIVA,AZURETTE,MIRCETTE Take 1 tablet by mouth daily.   ibuprofen 200 MG tablet Commonly known as:  ADVIL,MOTRIN Take 200 mg by mouth every 6 (six) hours as needed.   phentermine 37.5 MG capsule Take 1 capsule (37.5 mg total) by mouth every morning.   sertraline 50 MG tablet Commonly known as:  ZOLOFT Take 1 tablet (50 mg total) by mouth daily.   SUMAtriptan 100 MG tablet Commonly known as:  IMITREX Take 1 tablet (100 mg total) by mouth every 2 (two) hours as needed for migraine. May repeat in 2 hours if headache  persists or recurs.          Objective:    BP 108/74   Pulse 68   Temp (!) 97.2 F (36.2 C) (Oral)   Ht 5\' 8"  (1.727 m)   Wt 272 lb (123.4 kg)   BMI 41.36 kg/m   No Known Allergies  Physical Exam  Constitutional: She is oriented to person, place, and time. She appears well-developed and well-nourished.  HENT:  Head: Normocephalic and atraumatic.  Eyes: Pupils are equal, round, and reactive to light. Conjunctivae and EOM are normal.  Cardiovascular: Normal rate, regular rhythm, normal heart sounds and intact distal pulses.   Pulmonary/Chest: Effort normal and breath sounds normal.  Abdominal: Soft. Bowel sounds are normal.  Neurological: She is alert and oriented to person, place, and time. She has normal reflexes.  Skin: Skin is warm and dry. No rash noted.  Psychiatric: She has a normal mood and affect. Her behavior is normal. Judgment and thought content normal.    Results for orders placed or performed in visit on 06/27/17  Microscopic Examination  Result Value Ref Range   WBC, UA None seen 0 - 5 /hpf   RBC, UA 3-10 (A) 0 - 2 /hpf   Epithelial Cells (  non renal) 0-10 0 - 10 /hpf   Renal Epithel, UA None seen None seen /hpf   Bacteria, UA Few None seen/Few  Urinalysis, Complete  Result Value Ref Range   Specific Gravity, UA 1.025 1.005 - 1.030   pH, UA 7.0 5.0 - 7.5   Color, UA Yellow Yellow   Appearance Ur Clear Clear   Leukocytes, UA Negative Negative   Protein, UA Negative Negative/Trace   Glucose, UA Negative Negative   Ketones, UA Trace (A) Negative   RBC, UA 1+ (A) Negative   Bilirubin, UA Negative Negative   Urobilinogen, Ur 0.2 0.2 - 1.0 mg/dL   Nitrite, UA Negative Negative   Microscopic Examination See below:   Pap IG w/ reflex to HPV when ASC-U  Result Value Ref Range   DIAGNOSIS: Comment    Specimen adequacy: Comment    Clinician Provided ICD10 Comment    Performed by: Comment    PAP Smear Comment .    Note: Comment    Test Methodology  Comment    PAP Reflex Comment       Assessment & Plan:   1. Weight gain - phentermine 37.5 MG capsule; Take 1 capsule (37.5 mg total) by mouth every morning.  Dispense: 30 capsule; Refill: 1    Current Outpatient Prescriptions:  .  desogestrel-ethinyl estradiol (KARIVA,AZURETTE,MIRCETTE) 0.15-0.02/0.01 MG (21/5) tablet, Take 1 tablet by mouth daily., Disp: 1 Package, Rfl: 12 .  ibuprofen (ADVIL,MOTRIN) 200 MG tablet, Take 200 mg by mouth every 6 (six) hours as needed., Disp: , Rfl:  .  phentermine 37.5 MG capsule, Take 1 capsule (37.5 mg total) by mouth every morning., Disp: 30 capsule, Rfl: 1 .  sertraline (ZOLOFT) 50 MG tablet, Take 1 tablet (50 mg total) by mouth daily., Disp: 30 tablet, Rfl: 11 .  SUMAtriptan (IMITREX) 100 MG tablet, Take 1 tablet (100 mg total) by mouth every 2 (two) hours as needed for migraine. May repeat in 2 hours if headache persists or recurs., Disp: 10 tablet, Rfl: 2 Continue all other maintenance medications as listed above.  Follow up plan: Return in about 2 months (around 10/28/2017) for recheck.  Educational handout given for survey  Remus LofflerAngel S. Shanah Guimaraes PA-C [facility] [address]  [place], [place] [id number] [phone]   08/28/2017, 11:10 AM

## 2017-11-02 ENCOUNTER — Ambulatory Visit: Payer: PRIVATE HEALTH INSURANCE | Admitting: Physician Assistant

## 2017-12-12 ENCOUNTER — Other Ambulatory Visit: Payer: Self-pay | Admitting: Physician Assistant

## 2017-12-12 DIAGNOSIS — G43809 Other migraine, not intractable, without status migrainosus: Secondary | ICD-10-CM

## 2018-01-11 ENCOUNTER — Other Ambulatory Visit: Payer: Self-pay | Admitting: Physician Assistant

## 2018-01-11 DIAGNOSIS — F325 Major depressive disorder, single episode, in full remission: Secondary | ICD-10-CM

## 2018-03-08 ENCOUNTER — Other Ambulatory Visit: Payer: Self-pay | Admitting: Physician Assistant

## 2018-03-08 DIAGNOSIS — F325 Major depressive disorder, single episode, in full remission: Secondary | ICD-10-CM

## 2018-04-01 ENCOUNTER — Other Ambulatory Visit: Payer: Self-pay | Admitting: Family Medicine

## 2018-04-01 DIAGNOSIS — R635 Abnormal weight gain: Secondary | ICD-10-CM

## 2018-04-02 NOTE — Telephone Encounter (Signed)
Last seen 08-28-17

## 2018-04-05 ENCOUNTER — Telehealth: Payer: Self-pay

## 2018-04-05 NOTE — Telephone Encounter (Signed)
Insurance denied Phentermine

## 2018-04-06 NOTE — Telephone Encounter (Signed)
Will have to pay cash

## 2018-04-26 ENCOUNTER — Telehealth: Payer: Self-pay | Admitting: Physician Assistant

## 2018-04-26 NOTE — Telephone Encounter (Signed)
yes

## 2018-04-27 ENCOUNTER — Ambulatory Visit: Payer: PRIVATE HEALTH INSURANCE | Admitting: Physician Assistant

## 2018-04-27 ENCOUNTER — Encounter: Payer: Self-pay | Admitting: Physician Assistant

## 2018-04-27 VITALS — BP 109/75 | HR 78 | Temp 97.5°F | Ht 68.0 in | Wt 268.8 lb

## 2018-04-27 DIAGNOSIS — R635 Abnormal weight gain: Secondary | ICD-10-CM

## 2018-04-27 DIAGNOSIS — N915 Oligomenorrhea, unspecified: Secondary | ICD-10-CM | POA: Diagnosis not present

## 2018-04-27 MED ORDER — PHENTERMINE HCL 37.5 MG PO CAPS: ORAL_CAPSULE | ORAL | 1 refills | Status: DC

## 2018-04-27 MED ORDER — NORETHINDRONE-ETH ESTRADIOL 0.5-35 MG-MCG PO TABS: 1.0000 | ORAL_TABLET | Freq: Every day | ORAL | 11 refills | Status: DC

## 2018-04-27 MED ORDER — PHENTERMINE HCL 37.5 MG PO CAPS
ORAL_CAPSULE | ORAL | 1 refills | Status: DC
Start: 2018-04-27 — End: 2018-04-27

## 2018-04-27 MED ORDER — MEDROXYPROGESTERONE ACETATE 10 MG PO TABS: 10.0000 mg | ORAL_TABLET | Freq: Every day | ORAL | 0 refills | Status: DC

## 2018-04-27 NOTE — Telephone Encounter (Signed)
Patient has appointment today

## 2018-04-27 NOTE — Progress Notes (Signed)
BP 109/75   Pulse 78   Temp (!) 97.5 F (36.4 C) (Oral)   Ht 5\' 8"  (1.727 m)   Wt 268 lb 12.8 oz (121.9 kg)   BMI 40.87 kg/m    Subjective:    Patient ID: Michaela Campbell, female    DOB: 12/08/1985, 32 y.o.   MRN: [id number]  HPI: Michaela Campbell is a 32 y.o. female presenting on 04/27/2018 for Oligomenorrhea (patient is on cycle now for 3 weeks)    Past Medical History:  Diagnosis Date  . Anxiety    Relevant past medical, surgical, family and social history reviewed and updated as indicated. Interim medical history since our last visit reviewed. Allergies and medications reviewed and updated. DATA REVIEWED: CHART IN EPIC  Family History reviewed for pertinent findings.  Review of Systems  Constitutional: Negative.  Negative for activity change, fatigue and fever.  HENT: Negative.   Eyes: Negative.   Respiratory: Negative.  Negative for cough.   Cardiovascular: Negative.  Negative for chest pain.  Gastrointestinal: Negative.  Negative for abdominal pain.  Endocrine: Negative.   Genitourinary: Positive for menstrual problem and pelvic pain. Negative for dysuria, hematuria, vaginal discharge and vaginal pain.  Musculoskeletal: Negative.   Skin: Negative.   Neurological: Negative.     Allergies as of 04/27/2018   No Known Allergies     Medication List        Accurate as of 04/27/18  2:43 PM. Always use your most recent med list.          ibuprofen 200 MG tablet Commonly known as:  ADVIL,MOTRIN Take 200 mg by mouth every 6 (six) hours as needed.   medroxyPROGESTERone 10 MG tablet Commonly known as:  PROVERA Take 1 tablet (10 mg total) by mouth daily.   norethindrone-ethinyl estradiol 0.5-35 MG-MCG tablet Commonly known as:  NECON 0.5/35 (28) Take 1 tablet by mouth daily.   phentermine 37.5 MG capsule TAKE 1 CAPSULE IN AM   sertraline 50 MG tablet Commonly known as:  ZOLOFT TAKE 1 TABLET (50 MG TOTAL) BY MOUTH DAILY.   SUMAtriptan 100 MG  tablet Commonly known as:  IMITREX 1 TAB EVERY 2 HOURS AS NEEDED FOR MIGRAINE MAY REPEAT IN 2 HOURS IF HEADACHE PERSISTS OR RECURS          Objective:    BP 109/75   Pulse 78   Temp (!) 97.5 F (36.4 C) (Oral)   Ht 5\' 8"  (1.727 m)   Wt 268 lb 12.8 oz (121.9 kg)   BMI 40.87 kg/m   No Known Allergies  Wt Readings from Last 3 Encounters:  04/27/18 268 lb 12.8 oz (121.9 kg)  08/28/17 272 lb (123.4 kg)  06/27/17 274 lb (124.3 kg)    Physical Exam  Constitutional: She is oriented to person, place, and time. She appears well-developed and well-nourished.  HENT:  Head: Normocephalic and atraumatic.  Eyes: Pupils are equal, round, and reactive to light. Conjunctivae and EOM are normal.  Cardiovascular: Normal rate, regular rhythm, normal heart sounds and intact distal pulses.  Pulmonary/Chest: Effort normal and breath sounds normal.  Abdominal: Soft. Bowel sounds are normal.  Neurological: She is alert and oriented to person, place, and time. She has normal reflexes.  Skin: Skin is warm and dry. No rash noted.  Psychiatric: She has a normal mood and affect. Her behavior is normal. Judgment and thought content normal.        Assessment & Plan:   1. Oligomenorrhea, unspecified type  Provera 10 mg one daily, 10 days Necon 1/35 as next birth control pill for cycle control  2. Weight gain - phentermine 37.5 MG capsule; TAKE 1 CAPSULE IN AM  Dispense: 30 capsule; Refill: 1   Continue all other maintenance medications as listed above.  Follow up plan: No follow-ups on file.  Educational handout given for survey  Remus Loffler PA-C [facility] [address]  [place], [place] [address] [phone]   04/27/2018, 2:43 PM

## 2018-06-29 ENCOUNTER — Ambulatory Visit (INDEPENDENT_AMBULATORY_CARE_PROVIDER_SITE_OTHER): Payer: PRIVATE HEALTH INSURANCE | Admitting: Physician Assistant

## 2018-06-29 ENCOUNTER — Encounter: Payer: Self-pay | Admitting: Physician Assistant

## 2018-06-29 VITALS — BP 100/64 | HR 78 | Temp 97.7°F | Ht 68.0 in | Wt 277.4 lb

## 2018-06-29 DIAGNOSIS — N6011 Diffuse cystic mastopathy of right breast: Secondary | ICD-10-CM

## 2018-06-29 DIAGNOSIS — G43809 Other migraine, not intractable, without status migrainosus: Secondary | ICD-10-CM

## 2018-06-29 DIAGNOSIS — R635 Abnormal weight gain: Secondary | ICD-10-CM

## 2018-06-29 DIAGNOSIS — Z01419 Encounter for gynecological examination (general) (routine) without abnormal findings: Secondary | ICD-10-CM | POA: Diagnosis not present

## 2018-06-29 DIAGNOSIS — F325 Major depressive disorder, single episode, in full remission: Secondary | ICD-10-CM

## 2018-06-29 MED ORDER — MEDROXYPROGESTERONE ACETATE 10 MG PO TABS: 10.0000 mg | ORAL_TABLET | Freq: Every day | ORAL | 11 refills | Status: DC

## 2018-06-29 MED ORDER — FLUCONAZOLE 150 MG PO TABS: ORAL_TABLET | ORAL | 0 refills | Status: DC

## 2018-06-29 MED ORDER — PHENTERMINE HCL 37.5 MG PO CAPS: ORAL_CAPSULE | ORAL | 2 refills | Status: DC

## 2018-06-29 MED ORDER — NORETHINDRONE-ETH ESTRADIOL 0.5-35 MG-MCG PO TABS: 1.0000 | ORAL_TABLET | Freq: Every day | ORAL | 12 refills | Status: DC

## 2018-06-29 MED ORDER — SERTRALINE HCL 50 MG PO TABS: 50.0000 mg | ORAL_TABLET | Freq: Every day | ORAL | 3 refills | Status: DC

## 2018-06-29 MED ORDER — SUMATRIPTAN SUCCINATE 100 MG PO TABS
ORAL_TABLET | ORAL | 11 refills | Status: DC
Start: 2018-06-29 — End: 2019-08-02

## 2018-06-29 NOTE — Patient Instructions (Signed)
Fibrocystic Breast Changes  Fibrocystic breast changes are changes that can make your breasts swollen or painful. These changes happen when tiny sacs of fluid (cysts) form in the breast. This is a common condition. It does not mean that you have cancer. It usually happens because of hormone changes during a monthly period.  Follow these instructions at home:   Check your breasts after every monthly period. If you do not have monthly periods, check your breasts on the first day of every month. Check for:  ? Soreness.  ? New swelling or puffiness.  ? A change in breast size.  ? A change in a lump that was already there.   Take over-the-counter and prescription medicines only as told by your doctor.   Wear a support or sports bra that fits well. Wear this support especially when you are exercising.   Avoid or have less caffeine, fat, and sugar in what you eat and drink as told by your doctor.  Contact a doctor if:   You have fluid coming from your nipple, especially if the fluid has blood in it.   You have new lumps or bumps in your breast.   Your breast gets puffy, red, and painful.   You have changes in how your breast looks.   Your nipple looks flat or it sinks into your breast.  Get help right away if:   Your breast turns red, and the redness is spreading.  Summary   Fibrocystic breast changes are changes that can make your breasts swollen or painful.   This condition can happen when you have hormone changes during your monthly period.   With this condition, it is important to check your breasts after every monthly period. If you do not have monthly periods, check your breasts on the first day of every month.  This information is not intended to replace advice given to you by your health care provider. Make sure you discuss any questions you have with your health care provider.  Document Released: 09/29/2008 Document Revised: 06/30/2016 Document Reviewed: 06/30/2016  [employer]vier Interactive Patient  Education  2017 [employer]

## 2018-07-02 DIAGNOSIS — N6011 Diffuse cystic mastopathy of right breast: Secondary | ICD-10-CM | POA: Insufficient documentation

## 2018-07-02 NOTE — Progress Notes (Signed)
BP 100/64   Pulse 78   Temp 97.7 F (36.5 C) (Oral)   Ht 5' 8"  (1.727 m)   Wt 277 lb 6.4 oz (125.8 kg)   BMI 42.18 kg/m    Subjective:    Patient ID: Michaela Campbell, female    DOB: July 01, 1986, 32 y.o.   MRN: [id number]  HPI: Michaela Campbell is a 32 y.o. female presenting on 06/29/2018 for Annual Exam  This patient comes in for annual well physical examination. All medications are reviewed today. There are no reports of any problems with the medications. All of the medical conditions are reviewed and updated.  Lab work is reviewed and will be ordered as medically necessary. There are no new problems reported with today's visit.  Patient reports doing well overall.   Past Medical History:  Diagnosis Date  . Anxiety    Relevant past medical, surgical, family and social history reviewed and updated as indicated. Interim medical history since our last visit reviewed. Allergies and medications reviewed and updated. DATA REVIEWED: CHART IN EPIC  Family History reviewed for pertinent findings.  Review of Systems  Constitutional: Negative.  Negative for activity change, fatigue and fever.  HENT: Negative.   Eyes: Negative.   Respiratory: Negative.  Negative for cough.   Cardiovascular: Negative.  Negative for chest pain.  Gastrointestinal: Negative.  Negative for abdominal pain.  Endocrine: Negative.   Genitourinary: Negative.  Negative for dysuria.  Musculoskeletal: Negative.   Skin: Negative.   Neurological: Negative.     Allergies as of 06/29/2018   No Known Allergies     Medication List        Accurate as of 06/29/18 11:59 PM. Always use your most recent med list.          fluconazole 150 MG tablet Commonly known as:  DIFLUCAN 1 po q week x 4 weeks   ibuprofen 200 MG tablet Commonly known as:  ADVIL,MOTRIN Take 200 mg by mouth every 6 (six) hours as needed.   medroxyPROGESTERone 10 MG tablet Commonly known as:  PROVERA Take 1 tablet (10 mg total) by mouth  daily.   norethindrone-ethinyl estradiol 0.5-35 MG-MCG tablet Commonly known as:  NECON,BREVICON,MODICON Take 1 tablet by mouth daily.   phentermine 37.5 MG capsule TAKE 1 CAPSULE IN AM   sertraline 50 MG tablet Commonly known as:  ZOLOFT Take 1 tablet (50 mg total) by mouth daily.   SUMAtriptan 100 MG tablet Commonly known as:  IMITREX 1 TAB EVERY 2 HOURS AS NEEDED FOR MIGRAINE MAY REPEAT IN 2 HOURS IF HEADACHE PERSISTS OR RECURS          Objective:    BP 100/64   Pulse 78   Temp 97.7 F (36.5 C) (Oral)   Ht 5' 8"  (1.727 m)   Wt 277 lb 6.4 oz (125.8 kg)   BMI 42.18 kg/m   No Known Allergies  Wt Readings from Last 3 Encounters:  06/29/18 277 lb 6.4 oz (125.8 kg)  04/27/18 268 lb 12.8 oz (121.9 kg)  08/28/17 272 lb (123.4 kg)    Physical Exam  Constitutional: She is oriented to person, place, and time. She appears well-developed and well-nourished.  HENT:  Head: Normocephalic and atraumatic.  Eyes: Pupils are equal, round, and reactive to light. Conjunctivae and EOM are normal.  Neck: Normal range of motion. Neck supple.  Cardiovascular: Normal rate, regular rhythm, normal heart sounds and intact distal pulses.  Pulmonary/Chest: Effort normal and breath sounds normal. Right breast  exhibits no mass, no skin change and no tenderness. Left breast exhibits no mass, no skin change and no tenderness. No breast tenderness, discharge or bleeding. Breasts are symmetrical.  Cyst in right breast, movable  Abdominal: Soft. Bowel sounds are normal.  Genitourinary: Vagina normal and uterus normal. Rectal exam shows no fissure. No breast tenderness, discharge or bleeding. There is no tenderness or lesion on the right labia. There is no tenderness or lesion on the left labia. Uterus is not deviated, not enlarged and not tender. Cervix exhibits no motion tenderness, no discharge and no friability. Right adnexum displays no mass, no tenderness and no fullness. Left adnexum displays no  mass, no tenderness and no fullness. No tenderness or bleeding in the vagina. No vaginal discharge found.  Neurological: She is alert and oriented to person, place, and time. She has normal reflexes.  Skin: Skin is warm and dry. No rash noted.  Psychiatric: She has a normal mood and affect. Her behavior is normal. Judgment and thought content normal.    Results for orders placed or performed in visit on 06/27/17  Microscopic Examination  Result Value Ref Range   WBC, UA None seen 0 - 5 /hpf   RBC, UA 3-10 (A) 0 - 2 /hpf   Epithelial Cells (non renal) 0-10 0 - 10 /hpf   Renal Epithel, UA None seen None seen /hpf   Bacteria, UA Few None seen/Few  Urinalysis, Complete  Result Value Ref Range   Specific Gravity, UA 1.025 1.005 - 1.030   pH, UA 7.0 5.0 - 7.5   Color, UA Yellow Yellow   Appearance Ur Clear Clear   Leukocytes, UA Negative Negative   Protein, UA Negative Negative/Trace   Glucose, UA Negative Negative   Ketones, UA Trace (A) Negative   RBC, UA 1+ (A) Negative   Bilirubin, UA Negative Negative   Urobilinogen, Ur 0.2 0.2 - 1.0 mg/dL   Nitrite, UA Negative Negative   Microscopic Examination See below:   Pap IG w/ reflex to HPV when ASC-U  Result Value Ref Range   DIAGNOSIS: Comment    Specimen adequacy: Comment    Clinician Provided ICD10 Comment    Performed by: Comment    PAP Smear Comment .    Note: Comment    Test Methodology Comment    PAP Reflex Comment       Assessment & Plan:   1. Well female exam with routine gynecological exam - CBC with Differential/Platelet; Future - CMP14+EGFR; Future - Lipid panel; Future - TSH; Future - Pap IG w/ reflex to HPV when ASC-U  2. Weight gain - phentermine 37.5 MG capsule; TAKE 1 CAPSULE IN AM  Dispense: 30 capsule; Refill: 2  3. Depression, major, single episode, complete remission (HCC) - sertraline (ZOLOFT) 50 MG tablet; Take 1 tablet (50 mg total) by mouth daily.  Dispense: 90 tablet; Refill: 3  4. Other  migraine without status migrainosus, not intractable - SUMAtriptan (IMITREX) 100 MG tablet; 1 TAB EVERY 2 HOURS AS NEEDED FOR MIGRAINE MAY REPEAT IN 2 HOURS IF HEADACHE PERSISTS OR RECURS  Dispense: 10 tablet; Refill: 11  5. Fibrocystic disease of right breast Counsel Watch    Continue all other maintenance medications as listed above.  Follow up plan: Return in about 6 months (around 12/29/2018) for recheck.  Educational handout given for fibrocystic breast  Terald Sleeper PA-C [facility] [address]  [place], [place] [address] [phone]   07/02/2018, 6:08 PM

## 2018-07-04 LAB — PAP IG W/ RFLX HPV ASCU: PAP Smear Comment: 0

## 2018-12-31 ENCOUNTER — Ambulatory Visit (INDEPENDENT_AMBULATORY_CARE_PROVIDER_SITE_OTHER): Payer: PRIVATE HEALTH INSURANCE | Admitting: Physician Assistant

## 2018-12-31 ENCOUNTER — Encounter: Payer: Self-pay | Admitting: Physician Assistant

## 2018-12-31 VITALS — BP 110/68 | HR 76 | Temp 97.8°F | Ht 68.0 in | Wt 264.4 lb

## 2018-12-31 DIAGNOSIS — R635 Abnormal weight gain: Secondary | ICD-10-CM | POA: Diagnosis not present

## 2018-12-31 DIAGNOSIS — G5702 Lesion of sciatic nerve, left lower limb: Secondary | ICD-10-CM

## 2018-12-31 MED ORDER — MELOXICAM 7.5 MG PO TABS: 7.5000 mg | ORAL_TABLET | Freq: Every day | ORAL | 2 refills | Status: DC

## 2018-12-31 MED ORDER — SCOPOLAMINE 1 MG/3DAYS TD PT72: 1.0000 | MEDICATED_PATCH | TRANSDERMAL | 0 refills | Status: DC

## 2018-12-31 MED ORDER — PHENTERMINE HCL 37.5 MG PO CAPS: ORAL_CAPSULE | ORAL | 5 refills | Status: DC

## 2018-12-31 NOTE — Patient Instructions (Signed)

## 2018-12-31 NOTE — Progress Notes (Signed)
BP 110/68   Pulse 76   Temp 97.8 F (36.6 C) (Oral)   Ht 5\' 8"  (1.727 m)   Wt 264 lb 6.4 oz (119.9 kg)   BMI 40.20 kg/m    Subjective:    Patient ID: Michaela Campbell, female    DOB: 1986/01/14, 33 y.o.   MRN: [id number]  HPI: Michaela Campbell is a 33 y.o. female presenting on 12/31/2018 for No chief complaint on file.  Patient comes in for recheck on her weight loss efforts.  She is down 13 pounds on the phentermine and she is trying to work out and follow a healthy diet.  She is eating from all the food groups and trying to keep things whole and low-fat.  She has done a good job and is feeling very good overall.  She is having continued pain in the left hip.  We discussed this last time I discussed the possibility of some type of bursitis or piriformis injury.  She rarely takes ibuprofen but when she is hurting very badly she will take 2 at bedtime.  We have discussed the possibility of using a anti-inflammatory for about 10 days to see if we can get this calmed down.  We have also discussed using physical therapy exercises.  Past Medical History:  Diagnosis Date  . Anxiety    Relevant past medical, surgical, family and social history reviewed and updated as indicated. Interim medical history since our last visit reviewed. Allergies and medications reviewed and updated. DATA REVIEWED: CHART IN EPIC  Family History reviewed for pertinent findings.  Review of Systems  Constitutional: Negative.   HENT: Negative.   Eyes: Negative.   Respiratory: Negative.   Gastrointestinal: Negative.   Genitourinary: Negative.   Musculoskeletal: Positive for arthralgias and myalgias. Negative for joint swelling.    Allergies as of 12/31/2018   No Known Allergies     Medication List       Accurate as of December 31, 2018  9:39 AM. Always use your most recent med list.        meloxicam 7.5 MG tablet Commonly known as:  MOBIC Take 1 tablet (7.5 mg total) by mouth daily.     norethindrone-ethinyl estradiol 0.5-35 MG-MCG tablet Commonly known as:  NECON 0.5/35 (28) Take 1 tablet by mouth daily.   phentermine 37.5 MG capsule TAKE 1 CAPSULE IN AM   scopolamine 1 MG/3DAYS Commonly known as:  TRANSDERM-SCOP (1.5 MG) Place 1 patch (1.5 mg total) onto the skin every 3 (three) days.   sertraline 50 MG tablet Commonly known as:  ZOLOFT Take 1 tablet (50 mg total) by mouth daily.   SUMAtriptan 100 MG tablet Commonly known as:  IMITREX 1 TAB EVERY 2 HOURS AS NEEDED FOR MIGRAINE MAY REPEAT IN 2 HOURS IF HEADACHE PERSISTS OR RECURS          Objective:    BP 110/68   Pulse 76   Temp 97.8 F (36.6 C) (Oral)   Ht 5\' 8"  (1.727 m)   Wt 264 lb 6.4 oz (119.9 kg)   BMI 40.20 kg/m   No Known Allergies  Wt Readings from Last 3 Encounters:  12/31/18 264 lb 6.4 oz (119.9 kg)  06/29/18 277 lb 6.4 oz (125.8 kg)  04/27/18 268 lb 12.8 oz (121.9 kg)    Physical Exam Constitutional:      Appearance: She is well-developed.  HENT:     Head: Normocephalic and atraumatic.  Eyes:     Conjunctiva/sclera: Conjunctivae  normal.     Pupils: Pupils are equal, round, and reactive to light.  Cardiovascular:     Rate and Rhythm: Normal rate and regular rhythm.     Heart sounds: Normal heart sounds.  Pulmonary:     Effort: Pulmonary effort is normal.     Breath sounds: Normal breath sounds.  Abdominal:     General: Bowel sounds are normal.     Palpations: Abdomen is soft.  Skin:    General: Skin is warm and dry.     Findings: No rash.  Neurological:     Mental Status: She is alert and oriented to person, place, and time.     Deep Tendon Reflexes: Reflexes are normal and symmetric.  Psychiatric:        Behavior: Behavior normal.        Thought Content: Thought content normal.        Judgment: Judgment normal.     Results for orders placed or performed in visit on 06/29/18  Pap IG w/ reflex to HPV when ASC-U  Result Value Ref Range   DIAGNOSIS: Comment     Specimen adequacy: Comment    Clinician Provided ICD10 Comment    Performed by: Comment    PAP Smear Comment .    Note: Comment    Test Methodology Comment    PAP Reflex Comment       Assessment & Plan:   1. Weight gain - phentermine 37.5 MG capsule; TAKE 1 CAPSULE IN AM  Dispense: 30 capsule; Refill: 5  2. Piriformis syndrome of left side - meloxicam (MOBIC) 7.5 MG tablet; Take 1 tablet (7.5 mg total) by mouth daily.  Dispense: 30 tablet; Refill: 2 Try taking the meloxicam for 10 days and then stop.  Hopefully will have calm down the hip.  If not you can continue on a daily basis. Physical therapy exercises are given in her AVS Patient will also look online for physical therapy videos so that she can follow along with them.  Continue all other maintenance medications as listed above.  Follow up plan: Return in about 6 months (around 07/03/2019).  Educational handout given for survey  Remus Loffler PA-C [facility] [address]  [place], [place] [address] [phone]   12/31/2018, 9:39 AM

## 2019-05-21 ENCOUNTER — Encounter: Payer: Self-pay | Admitting: Nurse Practitioner

## 2019-05-21 ENCOUNTER — Ambulatory Visit (INDEPENDENT_AMBULATORY_CARE_PROVIDER_SITE_OTHER): Payer: PRIVATE HEALTH INSURANCE | Admitting: Nurse Practitioner

## 2019-05-21 ENCOUNTER — Other Ambulatory Visit: Payer: Self-pay

## 2019-05-21 VITALS — BP 121/78 | HR 79 | Temp 97.7°F | Ht 68.0 in | Wt 256.0 lb

## 2019-05-21 DIAGNOSIS — N926 Irregular menstruation, unspecified: Secondary | ICD-10-CM

## 2019-05-21 DIAGNOSIS — Z7251 High risk heterosexual behavior: Secondary | ICD-10-CM | POA: Diagnosis not present

## 2019-05-21 LAB — PREGNANCY, URINE: Preg Test, Ur: NEGATIVE

## 2019-05-21 NOTE — Progress Notes (Signed)
   Subjective:    Patient ID: Michaela Campbell, female    DOB: 1986-05-25, 33 y.o.   MRN: [id number]   Chief Complaint: wants pregnancy test and STD testing   HPI Patient comes in today stating that she needs a pregnancy test and STD testing. Her and her husband were separated for a short period of time and she was with someone else. She has reconciled with her husband and he is having some problems. So now she wants to be tested for std. Her last period was 04/22/19. She is suppose to start [place].   Review of Systems  Constitutional: Negative.   HENT: Negative.   Respiratory: Negative.   Cardiovascular: Negative.   Genitourinary: Positive for vaginal discharge and vaginal pain.  Musculoskeletal: Negative.   Neurological: Negative.   Psychiatric/Behavioral: Negative.   All other systems reviewed and are negative.      Objective:   Physical Exam Vitals signs and nursing note reviewed.  Constitutional:      General: She is in acute distress (moderate).     Appearance: Normal appearance. She is normal weight.  Cardiovascular:     Rate and Rhythm: Normal rate and regular rhythm.     Heart sounds: Normal heart sounds.  Pulmonary:     Breath sounds: Normal breath sounds.  Abdominal:     General: Abdomen is flat. Bowel sounds are normal.     Palpations: Abdomen is soft.  Musculoskeletal: Normal range of motion.  Skin:    General: Skin is warm and dry.  Neurological:     General: No focal deficit present.     Mental Status: She is alert and oriented to person, place, and time.  Psychiatric:        Mood and Affect: Mood normal.        Behavior: Behavior normal.    BP 121/78   Pulse 79   Temp 97.7 F (36.5 C) (Oral)   Ht 5\' 8"  (1.727 m)   Wt 256 lb (116.1 kg)   BMI 38.92 kg/m         Assessment & Plan:  Michaela Campbell in today with chief complaint of wants pregnancy test and STD testing   1. Missed period - Pregnancy, urine  2. High risk heterosexual  behavior Orders Placed This Encounter  Procedures  . Chlamydia/Gonococcus/Trichomonas, NAA  . Pregnancy, urine   Refused HIV, HSV and hepatitis testing Will call once test results are back  [place], FNP

## 2019-05-25 LAB — CHLAMYDIA/GONOCOCCUS/TRICHOMONAS, NAA
Chlamydia by NAA: NEGATIVE
Gonococcus by NAA: NEGATIVE
Trich vag by NAA: NEGATIVE

## 2019-05-27 ENCOUNTER — Other Ambulatory Visit: Payer: Self-pay | Admitting: Physician Assistant

## 2019-05-30 ENCOUNTER — Other Ambulatory Visit: Payer: Self-pay | Admitting: Physician Assistant

## 2019-06-10 ENCOUNTER — Other Ambulatory Visit: Payer: Self-pay | Admitting: Physician Assistant

## 2019-06-20 ENCOUNTER — Other Ambulatory Visit: Payer: Self-pay | Admitting: Physician Assistant

## 2019-07-03 ENCOUNTER — Encounter: Payer: PRIVATE HEALTH INSURANCE | Admitting: Physician Assistant

## 2019-07-30 ENCOUNTER — Encounter: Payer: PRIVATE HEALTH INSURANCE | Admitting: Physician Assistant

## 2019-08-01 ENCOUNTER — Other Ambulatory Visit: Payer: Self-pay

## 2019-08-02 ENCOUNTER — Ambulatory Visit (INDEPENDENT_AMBULATORY_CARE_PROVIDER_SITE_OTHER): Payer: PRIVATE HEALTH INSURANCE | Admitting: Physician Assistant

## 2019-08-02 ENCOUNTER — Encounter: Payer: Self-pay | Admitting: Physician Assistant

## 2019-08-02 VITALS — BP 116/71 | HR 72 | Temp 97.4°F | Ht 68.0 in | Wt 261.2 lb

## 2019-08-02 DIAGNOSIS — Z0001 Encounter for general adult medical examination with abnormal findings: Secondary | ICD-10-CM

## 2019-08-02 DIAGNOSIS — F325 Major depressive disorder, single episode, in full remission: Secondary | ICD-10-CM

## 2019-08-02 DIAGNOSIS — G43809 Other migraine, not intractable, without status migrainosus: Secondary | ICD-10-CM | POA: Diagnosis not present

## 2019-08-02 DIAGNOSIS — R635 Abnormal weight gain: Secondary | ICD-10-CM

## 2019-08-02 DIAGNOSIS — Z0000 Encounter for general adult medical examination without abnormal findings: Secondary | ICD-10-CM

## 2019-08-02 MED ORDER — SERTRALINE HCL 50 MG PO TABS: 50.0000 mg | ORAL_TABLET | Freq: Every day | ORAL | 3 refills | Status: DC

## 2019-08-02 MED ORDER — SUMATRIPTAN SUCCINATE 100 MG PO TABS: ORAL_TABLET | ORAL | 11 refills | Status: DC

## 2019-08-02 MED ORDER — PHENTERMINE HCL 37.5 MG PO CAPS: ORAL_CAPSULE | ORAL | 5 refills | Status: DC

## 2019-08-02 MED ORDER — CLOBETASOL PROPIONATE 0.05 % EX CREA: 1.0000 "application " | TOPICAL_CREAM | Freq: Two times a day (BID) | CUTANEOUS | 1 refills | Status: DC

## 2019-08-02 NOTE — Patient Instructions (Signed)
Health Maintenance, Female Adopting a healthy lifestyle and getting preventive care are important in promoting health and wellness. Ask your health care provider about:  The right schedule for you to have regular tests and exams.  Things you can do on your own to prevent diseases and keep yourself healthy. What should I know about diet, weight, and exercise? Eat a healthy diet   Eat a diet that includes plenty of vegetables, fruits, low-fat dairy products, and lean protein.  Do not eat a lot of foods that are high in solid fats, added sugars, or sodium. Maintain a healthy weight Body mass index (BMI) is used to identify weight problems. It estimates body fat based on height and weight. Your health care provider can help determine your BMI and help you achieve or maintain a healthy weight. Get regular exercise Get regular exercise. This is one of the most important things you can do for your health. Most adults should:  Exercise for at least 150 minutes each week. The exercise should increase your heart rate and make you sweat (moderate-intensity exercise).  Do strengthening exercises at least twice a week. This is in addition to the moderate-intensity exercise.  Spend less time sitting. Even light physical activity can be beneficial. Watch cholesterol and blood lipids Have your blood tested for lipids and cholesterol at 35 years of age, then have this test every 5 years. Have your cholesterol levels checked more often if:  Your lipid or cholesterol levels are high.  You are older than 35 years of age.  You are at high risk for heart disease. What should I know about cancer screening? Depending on your health history and family history, you may need to have cancer screening at various ages. This may include screening for:  Breast cancer.  Cervical cancer.  Colorectal cancer.  Skin cancer.  Lung cancer. What should I know about heart disease, diabetes, and high blood  pressure? Blood pressure and heart disease  High blood pressure causes heart disease and increases the risk of stroke. This is more likely to develop in people who have high blood pressure readings, are of African descent, or are overweight.  Have your blood pressure checked: ? Every 3-5 years if you are 18-39 years of age. ? Every year if you are 40 years old or older. Diabetes Have regular diabetes screenings. This checks your fasting blood sugar level. Have the screening done:  Once every three years after age 40 if you are at a normal weight and have a low risk for diabetes.  More often and at a younger age if you are overweight or have a high risk for diabetes. What should I know about preventing infection? Hepatitis B If you have a higher risk for hepatitis B, you should be screened for this virus. Talk with your health care provider to find out if you are at risk for hepatitis B infection. Hepatitis C Testing is recommended for:  Everyone born from 1945 through 1965.  Anyone with known risk factors for hepatitis C. Sexually transmitted infections (STIs)  Get screened for STIs, including gonorrhea and chlamydia, if: ? You are sexually active and are younger than 35 years of age. ? You are older than 35 years of age and your health care provider tells you that you are at risk for this type of infection. ? Your sexual activity has changed since you were last screened, and you are at increased risk for chlamydia or gonorrhea. Ask your health care provider if   you are at risk.  Ask your health care provider about whether you are at high risk for HIV. Your health care provider may recommend a prescription medicine to help prevent HIV infection. If you choose to take medicine to prevent HIV, you should first get tested for HIV. You should then be tested every 3 months for as long as you are taking the medicine. Pregnancy  If you are about to stop having your period (premenopausal) and  you may become pregnant, seek counseling before you get pregnant.  Take 400 to 800 micrograms (mcg) of folic acid every day if you become pregnant.  Ask for birth control (contraception) if you want to prevent pregnancy. Osteoporosis and menopause Osteoporosis is a disease in which the bones lose minerals and strength with aging. This can result in bone fractures. If you are 65 years old or older, or if you are at risk for osteoporosis and fractures, ask your health care provider if you should:  Be screened for bone loss.  Take a calcium or vitamin D supplement to lower your risk of fractures.  Be given hormone replacement therapy (HRT) to treat symptoms of menopause. Follow these instructions at home: Lifestyle  Do not use any products that contain nicotine or tobacco, such as cigarettes, e-cigarettes, and chewing tobacco. If you need help quitting, ask your health care provider.  Do not use street drugs.  Do not share needles.  Ask your health care provider for help if you need support or information about quitting drugs. Alcohol use  Do not drink alcohol if: ? Your health care provider tells you not to drink. ? You are pregnant, may be pregnant, or are planning to become pregnant.  If you drink alcohol: ? Limit how much you use to 0-1 drink a day. ? Limit intake if you are breastfeeding.  Be aware of how much alcohol is in your drink. In the [place] one drink equals one 12 oz bottle of beer (355 mL), one 5 oz glass of wine (148 mL), or one 1 oz glass of hard liquor (44 mL). General instructions  Schedule regular health, dental, and eye exams.  Stay current with your vaccines.  Tell your health care provider if: ? You often feel depressed. ? You have ever been abused or do not feel safe at home. Summary  Adopting a healthy lifestyle and getting preventive care are important in promoting health and wellness.  Follow your health care provider's instructions about healthy  diet, exercising, and getting tested or screened for diseases.  Follow your health care provider's instructions on monitoring your cholesterol and blood pressure. This information is not intended to replace advice given to you by your health care provider. Make sure you discuss any questions you have with your health care provider. Document Released: 05/02/2011 Document Revised: 10/10/2018 Document Reviewed: 10/10/2018 Elsevier Patient Education  2020 [employer]  

## 2019-08-04 NOTE — Progress Notes (Signed)
BP 116/71   Pulse 72   Temp (!) 97.4 F (36.3 C) (Temporal)   Ht _0  (1.727 m)   Wt 261 lb 3.2 oz (118.5 kg)   LMP 07/13/2019   SpO2 97%   BMI 39.72 kg/m    Subjective:    Patient ID: Michaela Campbell, female    DOB: [birth year], 33 y.o.   MRN: [id number]  HPI: Michaela Campbell is a 33 y.o. female presenting on 08/02/2019 for Annual Exam  This patient comes in for annual well physical examination. All medications are reviewed today. There are no reports of any problems with the medications. All of the medical conditions are reviewed and updated.  Lab work is reviewed and will be ordered as medically necessary. There are no new problems reported with today's visit.  Patient reports doing well overall.  She is still continuing to use Nortrel for birth control and it is doing a very good job with that and cycle control.  She does need refills on her Imitrex, Zoloft, phentermine.  She is also having a little bit of hand peeling.  She states she does have to use hand sanitizer a significant amount of time.  And she does feel like it has dried her out.  She has never had a history of eczema in the past   Past Medical History:  Diagnosis Date  . Anxiety    Relevant past medical, surgical, family and social history reviewed and updated as indicated. Interim medical history since our last visit reviewed. Allergies and medications reviewed and updated. DATA REVIEWED: CHART IN EPIC  Family History reviewed for pertinent findings.  Review of Systems  Constitutional: Negative.  Negative for activity change, fatigue and fever.  HENT: Negative.   Eyes: Negative.   Respiratory: Negative.  Negative for cough.   Cardiovascular: Negative.  Negative for chest pain.  Gastrointestinal: Negative.  Negative for abdominal pain.  Endocrine: Negative.   Genitourinary: Negative.  Negative for dysuria.  Musculoskeletal: Positive for arthralgias.  Skin: Positive for rash.  Neurological: Negative.     Allergies as of 08/02/2019   No Known Allergies     Medication List       Accurate as of August 02, 2019 11:59 PM. If you have any questions, ask your nurse or doctor.        STOP taking these medications   fluconazole 150 MG tablet Commonly known as: DIFLUCAN Stopped by: Terald Sleeper, PA-C     TAKE these medications   clobetasol cream 0.05 % Commonly known as: TEMOVATE Apply 1 application topically 2 (two) times daily. Started by: Terald Sleeper, PA-C   Nortrel 0.5/35 (28) 0.5-35 MG-MCG tablet Generic drug: norethindrone-ethinyl estradiol TAKE 1 TABLET BY MOUTH EVERY DAY   phentermine 37.5 MG capsule TAKE 1 CAPSULE IN AM   sertraline 50 MG tablet Commonly known as: ZOLOFT Take 1 tablet (50 mg total) by mouth daily.   SUMAtriptan 100 MG tablet Commonly known as: IMITREX 1 TAB EVERY 2 HOURS AS NEEDED FOR MIGRAINE MAY REPEAT IN 2 HOURS IF HEADACHE PERSISTS OR RECURS          Objective:    BP 116/71   Pulse 72   Temp (!) 97.4 F (36.3 C) (Temporal)   Ht _1  (1.727 m)   Wt 261 lb 3.2 oz (118.5 kg)   LMP 07/13/2019   SpO2 97%   BMI 39.72 kg/m   No Known Allergies  Wt Readings from Last 3  Encounters:  08/02/19 261 lb 3.2 oz (118.5 kg)  05/21/19 256 lb (116.1 kg)  12/31/18 264 lb 6.4 oz (119.9 kg)    Physical Exam Constitutional:      Appearance: She is well-developed.  HENT:     Head: Normocephalic and atraumatic.  Eyes:     Conjunctiva/sclera: Conjunctivae normal.     Pupils: Pupils are equal, round, and reactive to light.  Neck:     Musculoskeletal: Normal range of motion and neck supple.  Cardiovascular:     Rate and Rhythm: Normal rate and regular rhythm.     Heart sounds: Normal heart sounds.  Pulmonary:     Effort: Pulmonary effort is normal.     Breath sounds: Normal breath sounds.  Chest:     Breasts: Breasts are symmetrical.        Right: No mass, skin change or tenderness.        Left: No mass, skin change or tenderness.   Abdominal:     General: Bowel sounds are normal.     Palpations: Abdomen is soft.  Genitourinary:    Labia:        Right: No tenderness or lesion.        Left: No tenderness or lesion.      Vagina: Normal. No vaginal discharge, tenderness or bleeding.     Cervix: No cervical motion tenderness, discharge or friability.     Uterus: Not deviated, not enlarged and not tender.      Adnexa:        Right: No mass, tenderness or fullness.         Left: No mass, tenderness or fullness.       Rectum: No anal fissure.  Skin:    General: Skin is warm and dry.     Findings: No rash.  Neurological:     Mental Status: She is alert and oriented to person, place, and time.     Deep Tendon Reflexes: Reflexes are normal and symmetric.  Psychiatric:        Behavior: Behavior normal.        Thought Content: Thought content normal.        Judgment: Judgment normal.     Results for orders placed or performed in visit on 05/21/19  Chlamydia/Gonococcus/Trichomonas, NAA   Specimen: Urine   UR  Result Value Ref Range   Chlamydia by NAA Negative Negative   Gonococcus by NAA Negative Negative   Trich vag by NAA Negative Negative  Pregnancy, urine  Result Value Ref Range   Preg Test, Ur Negative Negative      Assessment & Plan:   1. Well adult exam - IGP, Aptima HPV, rfx 16/18,45 - CBC with Differential/Platelet; Future - CMP14+EGFR; Future - Lipid panel; Future - TSH; Future  2. Weight gain - phentermine 37.5 MG capsule; TAKE 1 CAPSULE IN AM  Dispense: 30 capsule; Refill: 5  3. Depression, major, single episode, complete remission (HCC) - sertraline (ZOLOFT) 50 MG tablet; Take 1 tablet (50 mg total) by mouth daily.  Dispense: 90 tablet; Refill: 3  4. Other migraine without status migrainosus, not intractable  - SUMAtriptan (IMITREX) 100 MG tablet; 1 TAB EVERY 2 HOURS AS NEEDED FOR MIGRAINE MAY REPEAT IN 2 HOURS IF HEADACHE PERSISTS OR RECURS  Dispense: 10 tablet; Refill: 11   Continue  all other maintenance medications as listed above.  Follow up plan: No follow-ups on file.  Educational handout given for health maintenance  Terald Sleeper PA-C  [facility] [address]  [place], [place] [address] [phone]   08/04/2019, 9:00 PM

## 2019-08-07 LAB — IGP, APTIMA HPV, RFX 16/18,45: HPV Aptima: NEGATIVE

## 2019-08-14 ENCOUNTER — Other Ambulatory Visit: Payer: Self-pay

## 2019-08-14 ENCOUNTER — Other Ambulatory Visit: Payer: PRIVATE HEALTH INSURANCE

## 2019-08-14 DIAGNOSIS — Z0000 Encounter for general adult medical examination without abnormal findings: Secondary | ICD-10-CM

## 2019-08-15 LAB — CMP14+EGFR
ALT: 13 IU/L (ref 0–32)
AST: 15 IU/L (ref 0–40)
Albumin/Globulin Ratio: 1.4 (ref 1.2–2.2)
Albumin: 3.9 g/dL (ref 3.8–4.8)
Alkaline Phosphatase: 58 IU/L (ref 39–117)
BUN/Creatinine Ratio: 16 (ref 9–23)
BUN: 13 mg/dL (ref 6–20)
Bilirubin Total: <0.2 mg/dL (ref 0.0–1.2)
CO2: 23 mmol/L (ref 20–29)
Calcium: 9 mg/dL (ref 8.7–10.2)
Chloride: 103 mmol/L (ref 96–106)
Creatinine, Ser: 0.83 mg/dL (ref 0.57–1.00)
GFR calc Af Amer: 106 mL/min/{1.73_m2} (ref >59)
GFR calc non Af Amer: 92 mL/min/{1.73_m2} (ref >59)
Globulin, Total: 2.8 g/dL (ref 1.5–4.5)
Glucose: 89 mg/dL (ref 65–99)
Potassium: 4.9 mmol/L (ref 3.5–5.2)
Sodium: 141 mmol/L (ref 134–144)
Total Protein: 6.7 g/dL (ref 6.0–8.5)

## 2019-08-15 LAB — CBC WITH DIFFERENTIAL/PLATELET
Basophils Absolute: 0 10*3/uL (ref 0.0–0.2)
Basos: 0 %
EOS (ABSOLUTE): 0.1 10*3/uL (ref 0.0–0.4)
Eos: 1 %
Hematocrit: 39 % (ref 34.0–46.6)
Hemoglobin: 12.7 g/dL (ref 11.1–15.9)
Immature Grans (Abs): 0 10*3/uL (ref 0.0–0.1)
Immature Granulocytes: 0 %
Lymphocytes Absolute: 2.6 10*3/uL (ref 0.7–3.1)
Lymphs: 31 %
MCH: 28.8 pg (ref 26.6–33.0)
MCHC: 32.6 g/dL (ref 31.5–35.7)
MCV: 88 fL (ref 79–97)
Monocytes Absolute: 0.5 10*3/uL (ref 0.1–0.9)
Monocytes: 6 %
Neutrophils Absolute: 5.3 10*3/uL (ref 1.4–7.0)
Neutrophils: 62 %
Platelets: 242 10*3/uL (ref 150–450)
RBC: 4.41 x10E6/uL (ref 3.77–5.28)
RDW: 13 % (ref 11.7–15.4)
WBC: 8.5 10*3/uL (ref 3.4–10.8)

## 2019-08-15 LAB — TSH: TSH: 2.19 u[IU]/mL (ref 0.450–4.500)

## 2019-08-15 LAB — LIPID PANEL
Chol/HDL Ratio: 2.8 ratio (ref 0.0–4.4)
Cholesterol, Total: 185 mg/dL (ref 100–199)
HDL: 65 mg/dL (ref >39)
LDL Chol Calc (NIH): 100 mg/dL — ABNORMAL HIGH (ref 0–99)
Triglycerides: 112 mg/dL (ref 0–149)
VLDL Cholesterol Cal: 20 mg/dL (ref 5–40)

## 2019-09-09 ENCOUNTER — Telehealth: Payer: Self-pay | Admitting: Family Medicine

## 2019-09-09 NOTE — Telephone Encounter (Signed)
**  [facility] After Hours/ Emergency Line Call**  Patient: Michaela Campbell.  PCP: Terald Sleeper, PA-C  Patient reports that she was diagnosed with COVID-19 about a week and a half ago.  Both she and her husband are isolating at home.  She is endorsing nasal and chest congestion, fatigue that is refractory to TheraFlu.  She is calling to see if there are any other therapies that she can be using.  Denying fever, vomiting.  Recommended that she use pseudoephedrine, Flonase plus or minus Afrin nasal spray every afternoon for a max of 3 days.  Discussed holding phentermine.  Hydration encouraged.  Red flags discussed.  Will forward to PCP.  Ashly M. Lajuana Ripple, DO

## 2020-02-21 ENCOUNTER — Encounter: Payer: Self-pay | Admitting: Family Medicine

## 2020-02-21 ENCOUNTER — Telehealth (INDEPENDENT_AMBULATORY_CARE_PROVIDER_SITE_OTHER): Payer: PRIVATE HEALTH INSURANCE | Admitting: Family Medicine

## 2020-02-21 DIAGNOSIS — M79621 Pain in right upper arm: Secondary | ICD-10-CM

## 2020-02-21 NOTE — Progress Notes (Signed)
Virtual Visit via Video note  I connected with Michaela Campbell on 02/21/20 at 11:31 AM by video and verified that I am speaking with the correct person using two identifiers. Michaela Campbell is currently located at work and nobody is currently with her during visit. The provider, Loman Brooklyn, FNP is located in their home at time of visit.  I discussed the limitations, risks, security and privacy concerns of performing an evaluation and management service by video and the availability of in person appointments. I also discussed with the patient that there may be a patient responsible charge related to this service. The patient expressed understanding and agreed to proceed.  Subjective: PCP: Terald Sleeper, PA-C  Chief Complaint  Patient presents with  . Arm Pain   Patient reports 4 weeks ago she fell down some steps at her sister-in-law's house.  She has been taking ibuprofen and applying icy hot but states there has been no improvement in her pain.  The pain is not constant but occurs when she puts pressure through that arm or does things like pull down her tank top or buckle her bra.  She rates the pain 5/10 when this happens.  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  She felt it would be better by now.   ROS: Per HPI  Current Outpatient Medications:  .  clobetasol cream (TEMOVATE) 8.65 %, Apply 1 application topically 2 (two) times daily., Disp: 60 g, Rfl: 1 .  NORTREL 0.5/35, 28, 0.5-35 MG-MCG tablet, TAKE 1 TABLET BY MOUTH EVERY DAY, Disp: 84 tablet, Rfl: 4 .  phentermine 37.5 MG capsule, TAKE 1 CAPSULE IN AM, Disp: 30 capsule, Rfl: 5 .  sertraline (ZOLOFT) 50 MG tablet, Take 1 tablet (50 mg total) by mouth daily., Disp: 90 tablet, Rfl: 3 .  SUMAtriptan (IMITREX) 100 MG tablet, 1 TAB EVERY 2 HOURS AS NEEDED FOR MIGRAINE MAY REPEAT IN 2 HOURS IF HEADACHE PERSISTS OR RECURS, Disp: 10 tablet, Rfl:  11  No Known Allergies Past Medical History:  Diagnosis Date  . Anxiety     Observations/Objective: Physical Exam Constitutional:      General: She is not in acute distress.    Appearance: Normal appearance. She is not ill-appearing or toxic-appearing.  Eyes:     General: No scleral icterus.       Right eye: No discharge.        Left eye: No discharge.     Conjunctiva/sclera: Conjunctivae normal.  Pulmonary:     Effort: Pulmonary effort is normal. No respiratory distress.  Musculoskeletal:       Arms:     Comments: She is pointing to pain in the middle of the upper part of her arm.   Neurological:     Mental Status: She is alert and oriented to person, place, and time.  Psychiatric:        Mood and Affect: Mood normal.        Behavior: Behavior normal.        Thought Content: Thought content normal.        Judgment: Judgment normal.    Assessment and Plan: 1. Pain in right upper arm - Continue Ibuprofen and muscle rub.  - US SOFT TISSUE RT UPPER EXTREMITY LTD (NON-VASCULAR); Future   Follow Up Instructions:   I discussed the assessment and treatment plan with the patient. The patient was provided an  opportunity to ask questions and all were answered. The patient agreed with the plan and demonstrated an understanding of the instructions.   The patient was advised to call back or seek an in-person evaluation if the symptoms worsen or if the condition fails to improve as anticipated.  The above assessment and management plan was discussed with the patient. The patient verbalized understanding of and has agreed to the management plan. Patient is aware to call the clinic if symptoms persist or worsen. Patient is aware when to return to the clinic for a follow-up visit. Patient educated on when it is appropriate to go to the emergency department.   Time call ended: 11:40 AM  I provided 11 minutes of face-to-face time during this encounter.   Deliah Boston, MSN, APRN,  FNP-C [facility] 02/21/20

## 2020-02-27 ENCOUNTER — Ambulatory Visit (HOSPITAL_COMMUNITY)
Admission: RE | Admit: 2020-02-27 | Discharge: 2020-02-27 | Disposition: A | Discharge status: Home / Self Care | Payer: PRIVATE HEALTH INSURANCE | Source: Ambulatory Visit | Attending: Family Medicine | Admitting: Family Medicine

## 2020-02-27 ENCOUNTER — Other Ambulatory Visit: Payer: Self-pay

## 2020-02-27 ENCOUNTER — Encounter: Payer: Self-pay | Admitting: Family Medicine

## 2020-02-27 DIAGNOSIS — M79621 Pain in right upper arm: Secondary | ICD-10-CM | POA: Diagnosis present

## 2020-02-28 ENCOUNTER — Telehealth: Payer: Self-pay | Admitting: Nurse Practitioner

## 2020-02-28 NOTE — Telephone Encounter (Signed)
[email or web address]

## 2020-03-02 NOTE — Telephone Encounter (Signed)
Patient called and wanted MMM input on her arm pain.  Please refer to imaging on 4/29 and telephone note on 4/29.  Patient would like to know your thoughts on a steroid? Please advise

## 2020-03-04 ENCOUNTER — Other Ambulatory Visit: Payer: Self-pay | Admitting: Nurse Practitioner

## 2020-03-04 MED ORDER — PREDNISONE 20 MG PO TABS: ORAL_TABLET | ORAL | 0 refills | Status: DC

## 2020-03-04 NOTE — Telephone Encounter (Signed)
Patient notified and verbalized understanding. 

## 2020-03-04 NOTE — Telephone Encounter (Signed)
Steroids should be fine as long as we are sure there is no fracture. I will send in prescription.

## 2020-03-10 ENCOUNTER — Other Ambulatory Visit: Payer: Self-pay | Admitting: *Deleted

## 2020-03-10 DIAGNOSIS — R635 Abnormal weight gain: Secondary | ICD-10-CM

## 2020-03-31 ENCOUNTER — Ambulatory Visit (INDEPENDENT_AMBULATORY_CARE_PROVIDER_SITE_OTHER): Payer: PRIVATE HEALTH INSURANCE | Admitting: Nurse Practitioner

## 2020-03-31 ENCOUNTER — Other Ambulatory Visit: Payer: Self-pay

## 2020-03-31 ENCOUNTER — Encounter: Payer: Self-pay | Admitting: Nurse Practitioner

## 2020-03-31 VITALS — BP 134/83 | HR 82 | Temp 97.1°F | Ht 68.0 in | Wt 267.6 lb

## 2020-03-31 DIAGNOSIS — M25552 Pain in left hip: Secondary | ICD-10-CM

## 2020-03-31 DIAGNOSIS — W19XXXD Unspecified fall, subsequent encounter: Secondary | ICD-10-CM | POA: Diagnosis not present

## 2020-03-31 DIAGNOSIS — M25511 Pain in right shoulder: Secondary | ICD-10-CM | POA: Insufficient documentation

## 2020-03-31 DIAGNOSIS — W19XXXA Unspecified fall, initial encounter: Secondary | ICD-10-CM | POA: Insufficient documentation

## 2020-03-31 MED ORDER — PREDNISONE 10 MG (21) PO TBPK: ORAL_TABLET | ORAL | 0 refills | Status: DC

## 2020-03-31 MED ORDER — CYCLOBENZAPRINE HCL 5 MG PO TABS: 5.0000 mg | ORAL_TABLET | Freq: Three times a day (TID) | ORAL | 0 refills | Status: DC | PRN

## 2020-03-31 MED ORDER — NAPROXEN SODIUM 550 MG PO TABS: 550.0000 mg | ORAL_TABLET | Freq: Two times a day (BID) | ORAL | 0 refills | Status: DC

## 2020-03-31 NOTE — Progress Notes (Signed)
Acute Office Visit  Subjective:    Patient ID: Michaela Campbell, female    DOB: [birth year], 34 y.o.   MRN: [id number]  Chief Complaint  Patient presents with  . Shoulder Pain    right   . Wrist Pain    left   . Hip Pain    left    Shoulder Pain  The pain is present in the right shoulder, left wrist, right hip and left hip. This is a recurrent problem. The problem has been gradually worsening. The pain is at a severity of 5/10. The pain is moderate. Associated symptoms include a limited range of motion. The symptoms are aggravated by activity. She has tried rest for the symptoms. The treatment provided mild relief.     Past Medical History:  Diagnosis Date  . Anxiety     Past Surgical History:  Procedure Laterality Date  . WISDOM TOOTH EXTRACTION      Family History  Problem Relation Age of Onset  . Gout Mother   . Cancer Mother        melanoma  . Hypertension Mother   . Diabetes Father   . Hypertension Father   . Arthritis Father   . Heart attack Maternal Grandmother   . Heart attack Maternal Grandfather   . Diabetes Paternal Grandmother   . Arthritis Paternal Grandmother     Social History   Socioeconomic History  . Marital status: Married    Spouse name: Not on file  . Number of children: Not on file  . Years of education: Not on file  . Highest education level: Not on file  Occupational History  . Not on file  Tobacco Use  . Smoking status: Never Smoker  . Smokeless tobacco: Never Used  Substance and Sexual Activity  . Alcohol use: Yes    Comment: occ  . Drug use: No  . Sexual activity: Not on file  Other Topics Concern  . Not on file  Social History Narrative  . Not on file   Social Determinants of Health   Financial Resource Strain:   . Difficulty of Paying Living Expenses:   Food Insecurity:   . Worried About Programme researcher, broadcasting/film/video in the Last Year:   . Barista in the Last Year:   Transportation Needs:   . Automotive engineer (Medical):   Marland Kitchen Lack of Transportation (Non-Medical):   Physical Activity:   . Days of Exercise per Week:   . Minutes of Exercise per Session:   Stress:   . Feeling of Stress :   Social Connections:   . Frequency of Communication with Friends and Family:   . Frequency of Social Gatherings with Friends and Family:   . Attends Religious Services:   . Active Member of Clubs or Organizations:   . Attends Banker Meetings:   Marland Kitchen Marital Status:   Intimate Partner Violence:   . Fear of Current or Ex-Partner:   . Emotionally Abused:   Marland Kitchen Physically Abused:   . Sexually Abused:     Outpatient Medications Prior to Visit  Medication Sig Dispense Refill  . clobetasol cream (TEMOVATE) 0.05 % Apply 1 application topically 2 (two) times daily. 60 g 1  . NORTREL 0.5/35, 28, 0.5-35 MG-MCG tablet TAKE 1 TABLET BY MOUTH EVERY DAY 84 tablet 4  . phentermine 37.5 MG capsule TAKE 1 CAPSULE IN AM 30 capsule 5  . sertraline (ZOLOFT) 50 MG tablet Take 1 tablet (50  mg total) by mouth daily. 90 tablet 3  . SUMAtriptan (IMITREX) 100 MG tablet 1 TAB EVERY 2 HOURS AS NEEDED FOR MIGRAINE MAY REPEAT IN 2 HOURS IF HEADACHE PERSISTS OR RECURS 10 tablet 11  . predniSONE (DELTASONE) 20 MG tablet 2 po at sametime daily for 5 days 10 tablet 0   No facility-administered medications prior to visit.     Review of Systems  Eyes: Negative.   Respiratory: Negative.   Cardiovascular: Negative.   Gastrointestinal: Negative.   Endocrine: Negative.   Genitourinary: Negative for difficulty urinating and dysuria.  Musculoskeletal: Positive for myalgias.       Right shoulder pain, let wrist pain, bilateral hip pain  Skin: Negative for rash and wound.  Psychiatric/Behavioral: The patient is not nervous/anxious.        Objective:    Physical Exam Constitutional:      Appearance: Normal appearance.  HENT:     Head: Normocephalic.  Eyes:     Conjunctiva/sclera: Conjunctivae normal.    Cardiovascular:     Rate and Rhythm: Normal rate and regular rhythm.     Pulses: Normal pulses.     Heart sounds: Normal heart sounds.  Pulmonary:     Breath sounds: Normal breath sounds.  Abdominal:     General: Bowel sounds are normal.  Musculoskeletal:     Cervical back: Neck supple.     Comments: Right shoulder pain, left wrist pain, bilateral hip pain   Skin:    Findings: No erythema or rash.  Neurological:     Mental Status: She is alert and oriented to person, place, and time.  Psychiatric:        Mood and Affect: Mood normal.        Behavior: Behavior normal.     BP 134/83   Pulse 82   Temp (!) 97.1 F (36.2 C) (Temporal)   Ht 5\' 8"  (1.727 m)   Wt 267 lb 9.6 oz (121.4 kg)   LMP 03/31/2020   SpO2 98%   BMI 40.69 kg/m  Wt Readings from Last 3 Encounters:  03/31/20 267 lb 9.6 oz (121.4 kg)  08/02/19 261 lb 3.2 oz (118.5 kg)  05/21/19 256 lb (116.1 kg)    Health Maintenance Due  Topic Date Due  . COVID-19 Vaccine (1) Never done  . HIV Screening  Never done  . TETANUS/TDAP  07/17/2019       Lab Results  Component Value Date   TSH 2.190 08/14/2019   Lab Results  Component Value Date   WBC 8.5 08/14/2019   HGB 12.7 08/14/2019   HCT 39.0 08/14/2019   MCV 88 08/14/2019   PLT 242 08/14/2019   Lab Results  Component Value Date   NA 141 08/14/2019   K 4.9 08/14/2019   CO2 23 08/14/2019   GLUCOSE 89 08/14/2019   BUN 13 08/14/2019   CREATININE 0.83 08/14/2019   BILITOT <0.2 08/14/2019   ALKPHOS 58 08/14/2019   AST 15 08/14/2019   ALT 13 08/14/2019   PROT 6.7 08/14/2019   ALBUMIN 3.9 08/14/2019   CALCIUM 9.0 08/14/2019   Lab Results  Component Value Date   CHOL 185 08/14/2019   Lab Results  Component Value Date   HDL 65 08/14/2019   Lab Results  Component Value Date   LDLCALC 100 (H) 08/14/2019   Lab Results  Component Value Date   TRIG 112 08/14/2019   Lab Results  Component Value Date   CHOLHDL 2.8 08/14/2019  Assessment & Plan:  Fall Patient is a 34 year old female who present with fall 9 weeks ago. Patient is reporting increase hip pain, left wrist and right shoulder pain. Pain is not well managed. Provided education with printed hand out. Started patient on naproxen 550 mg, flexeril 5 mg,  prednosone pack and referral to orthopedic surgery for reevaluation.  RX sent to paharmacy  Acute pain of right shoulder Right shoulder pain in not well controlled, pain is progressively worse, started naproxen 550 mg , prednisone and flexeril. Provided education with printed handout. Patient knows to follow with unresolved or worsening symptoms.  Problem List Items Addressed This Visit    None    Visit Diagnoses    Acute pain of right shoulder    -  Primary   Relevant Medications   naproxen sodium (ANAPROX) 550 MG tablet   predniSONE (STERAPRED UNI-PAK 21 TAB) 10 MG (21) TBPK tablet   Hip pain, left       Relevant Medications   cyclobenzaprine (FLEXERIL) 5 MG tablet   Fall, subsequent encounter           Meds ordered this encounter  Medications  . naproxen sodium (ANAPROX) 550 MG tablet    Sig: Take 1 tablet (550 mg total) by mouth 2 (two) times daily with a meal.    Dispense:  30 tablet    Refill:  0    Order Specific Question:   Supervising Provider    Answer:   Arville Care A [[id number]]  . cyclobenzaprine (FLEXERIL) 5 MG tablet    Sig: Take 1 tablet (5 mg total) by mouth 3 (three) times daily as needed for muscle spasms.    Dispense:  30 tablet    Refill:  0    Order Specific Question:   Supervising Provider    Answer:   Arville Care A [id number]  . predniSONE (STERAPRED UNI-PAK 21 TAB) 10 MG (21) TBPK tablet    Sig: 60 mg by mouth day 1, 50 day 2, 40 day 3, 30 day 4, 20 day 5, 10 day 6    Dispense:  1 each    Refill:  0    Order Specific Question:   Supervising Provider    Answer:   Arville Care A [[id number]]     Daryll Drown, NP

## 2020-03-31 NOTE — Patient Instructions (Addendum)
Fall Patient is a 34 year old female who present with fall 9 weeks ago. Patient is reporting increase hip pain, left wrist and right shoulder pain. Pain is not well managed. Provided education with printed hand out. Started patient on naproxen 550 mg, flexeril 5 mg,  prednosone pack and referral to orthopedic surgery for reevaluation.  RX sent to [facility]armacy  Acute pain of right shoulder Right shoulder pain in not well controlled, pain is progressively worse, started naproxen 550 mg , prednisone and flexeril. Provided education with printed handout. Patient knows to follow with unresolved or worsening symptoms.   Shoulder Pain Many things can cause shoulder pain, including:  An injury.  Moving the shoulder in the same way again and again (overuse).  Joint pain (arthritis). Pain can come from:  Swelling and irritation (inflammation) of any part of the shoulder.  An injury to the shoulder joint.  An injury to: ? Tissues that connect muscle to bone (tendons). ? Tissues that connect bones to each other (ligaments). ? Bones. Follow these instructions at home: Watch for changes in your symptoms. Let your doctor know about them. Follow these instructions to help with your pain. If you have a sling:  Wear the sling as told by your doctor. Remove it only as told by your doctor.  Loosen the sling if your fingers: ? Tingle. ? Become numb. ? Turn cold and blue.  Keep the sling clean.  If the sling is not waterproof: ? Do not let it get wet. ? Take the sling off when you shower or bathe. Managing pain, stiffness, and swelling   If told, put ice on the painful area: ? Put ice in a plastic bag. ? Place a towel between your skin and the bag. ? Leave the ice on for 20 minutes, 2-3 times a day. Stop putting ice on if it does not help with the pain.  Squeeze a soft ball or a foam pad as much as possible. This prevents swelling in the shoulder. It also helps to strengthen the  arm. General instructions  Take over-the-counter and prescription medicines only as told by your doctor.  Keep all follow-up visits as told by your doctor. This is important. Contact a doctor if:  Your pain gets worse.  Medicine does not help your pain.  You have new pain in your arm, hand, or fingers. Get help right away if:  Your arm, hand, or fingers: ? Tingle. ? Are numb. ? Are swollen. ? Are painful. ? Turn white or blue. Summary  Shoulder pain can be caused by many things. These include injury, moving the shoulder in the same away again and again, and joint pain.  Watch for changes in your symptoms. Let your doctor know about them.  This condition may be treated with a sling, ice, and pain medicine.  Contact your doctor if the pain gets worse or you have new pain. Get help right away if your arm, hand, or fingers tingle or get numb, swollen, or painful.  Keep all follow-up visits as told by your doctor. This is important. This information is not intended to replace advice given to you by your health care provider. Make sure you discuss any questions you have with your health care provider. Document Revised: 05/01/2018 Document Reviewed: 05/01/2018 Elsevier Patient Education  2020 [employer].  Hip Pain The hip is the joint between the upper legs and the lower pelvis. The bones, cartilage, tendons, and muscles of your hip joint support your body  and allow you to move around. Hip pain can range from a minor ache to severe pain in one or both of your hips. The pain may be felt on the inside of the hip joint near the groin, or on the outside near the buttocks and upper thigh. You may also have swelling or stiffness in your hip area. Follow these instructions at home: Managing pain, stiffness, and swelling      If directed, put ice on the painful area. To do this: ? Put ice in a plastic bag. ? Place a towel between your skin and the bag. ? Leave the ice on for 20  minutes, 2-3 times a day.  If directed, apply heat to the affected area as often as told by your health care provider. Use the heat source that your health care provider recommends, such as a moist heat pack or a heating pad. ? Place a towel between your skin and the heat source. ? Leave the heat on for 20-30 minutes. ? Remove the heat if your skin turns bright red. This is especially important if you are unable to feel pain, heat, or cold. You may have a greater risk of getting burned. Activity  Do exercises as told by your health care provider.  Avoid activities that cause pain. General instructions   Take over-the-counter and prescription medicines only as told by your health care provider.  Keep a journal of your symptoms. Write down: ? How often you have hip pain. ? The location of your pain. ? What the pain feels like. ? What makes the pain worse.  Sleep with a pillow between your legs on your most comfortable side.  Keep all follow-up visits as told by your health care provider. This is important. Contact a health care provider if:  You cannot put weight on your leg.  Your pain or swelling continues or gets worse after one week.  It gets harder to walk.  You have a fever. Get help right away if:  You fall.  You have a sudden increase in pain and swelling in your hip.  Your hip is red or swollen or very tender to touch. Summary  Hip pain can range from a minor ache to severe pain in one or both of your hips.  The pain may be felt on the inside of the hip joint near the groin, or on the outside near the buttocks and upper thigh.  Avoid activities that cause pain.  Write down how often you have hip pain, the location of the pain, what makes it worse, and what it feels like. This information is not intended to replace advice given to you by your health care provider. Make sure you discuss any questions you have with your health care provider. Document Revised:  03/04/2019 Document Reviewed: 03/04/2019 Elsevier Patient Education  [place].  Wrist Pain, Adult There are many things that can cause wrist pain. Some common causes include: An injury to the wrist area. Overuse of the joint. A condition that causes too much pressure to be put on a nerve in the wrist (carpal tunnel syndrome). Wear and tear of the joints that happens as a person gets older (osteoarthritis). Other types of arthritis. Sometimes, the cause of wrist pain is not known. Often, the pain goes away when you follow your doctor's instructions for helping pain at home, such as resting or icing your wrist. If your wrist pain does not go away, it is important to tell your  doctor. Follow these instructions at home: Rest the wrist area for 48 hours or more, or as long as told by your doctor. If a splint or elastic bandage has been put on your wrist, use it as told by your doctor. Take off the splint or bandage only as told by your doctor. Loosen the splint or bandage if your fingers tingle, lose feeling (get numb), or turn cold or blue. If directed, apply ice to the injured area: If you have a removable splint or elastic bandage, remove it as told by your doctor. Put ice in a plastic bag. Place a towel between your skin and the bag or between your splint or bandage and the bag. Leave the ice on for 20 minutes, 2-3 times a day.  Keep your arm raised (elevated) above the level of your heart while you are sitting or lying down. Take over-the-counter and prescription medicines only as told by your doctor. Keep all follow-up visits as told by your doctor. This is important. Contact a doctor if: You have a sudden sharp pain in the wrist, hand, or arm that is different or new. The swelling or bruising on your wrist or hand gets worse. Your skin becomes red, gets a rash, or has open sores. Your pain does not get better or it gets worse. Get help right away if: You lose feeling in  your fingers or hand. Your fingers turn white, very red, or cold and blue. You cannot move your fingers. You have a fever or chills. This information is not intended to replace advice given to you by your health care provider. Make sure you discuss any questions you have with your health care provider. Document Revised: 09/29/2017 Document Reviewed: 05/05/2016 Elsevier Patient Education  2020 [employer].

## 2020-03-31 NOTE — Assessment & Plan Note (Signed)
Patient is a 34 year old female who present with fall 9 weeks ago. Patient is reporting increase hip pain, left wrist and right shoulder pain. Pain is not well managed. Provided education with printed hand out. Started patient on naproxen 550 mg, flexeril 5 mg,  prednosone pack and referral to orthopedic surgery for reevaluation.  RX sent to [facility]

## 2020-03-31 NOTE — Assessment & Plan Note (Signed)
Right shoulder pain in not well controlled, pain is progressively worse, started naproxen 550 mg , prednisone and flexeril. Provided education with printed handout. Patient knows to follow with unresolved or worsening symptoms.

## 2020-04-21 ENCOUNTER — Ambulatory Visit (INDEPENDENT_AMBULATORY_CARE_PROVIDER_SITE_OTHER): Payer: PRIVATE HEALTH INSURANCE

## 2020-04-21 ENCOUNTER — Encounter: Payer: Self-pay | Admitting: Orthopaedic Surgery

## 2020-04-21 ENCOUNTER — Ambulatory Visit (INDEPENDENT_AMBULATORY_CARE_PROVIDER_SITE_OTHER): Payer: PRIVATE HEALTH INSURANCE | Admitting: Orthopaedic Surgery

## 2020-04-21 DIAGNOSIS — G8929 Other chronic pain: Secondary | ICD-10-CM | POA: Diagnosis not present

## 2020-04-21 DIAGNOSIS — M25552 Pain in left hip: Secondary | ICD-10-CM

## 2020-04-21 DIAGNOSIS — M25511 Pain in right shoulder: Secondary | ICD-10-CM

## 2020-04-21 MED ORDER — DICLOFENAC SODIUM 75 MG PO TBEC: 75.0000 mg | DELAYED_RELEASE_TABLET | Freq: Two times a day (BID) | ORAL | 2 refills | Status: DC

## 2020-04-21 NOTE — Progress Notes (Signed)
Office Visit Note   Patient: Michaela Campbell           Date of Birth: [birth year]           MRN: [id number] Visit Date: 04/21/2020              Requested by: Daryll Drown, NP [address] [place],  [place] [address] PCP: Bennie Pierini, FNP   Assessment & Plan: Visit Diagnoses:  1. Pain in left hip   2. 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  She is hesitant to get a cortisone injection at this time and overall she would like to avoid cortisone injections if possible.  Prescription for diclofenac was sent in today.  We also talked about how weight contributes to degenerative joint disease pain.  For the right shoulder my sense is that she has sprain of her anterior shoulder ligaments that is causing her some issues with internal rotation.  I did offer a cortisone injection but she declined.  I gave her some exercises and she will see how this diclofenac helps.  Questions encouraged and answered.  Follow-up as needed.  Follow-Up Instructions: Return if symptoms worsen or fail to improve.   Orders:  Orders Placed This Encounter  Procedures   XR HIP UNILAT W OR W/O PELVIS 2-3 VIEWS LEFT   XR Shoulder Right   Meds ordered this encounter  Medications   diclofenac (VOLTAREN) 75 MG EC tablet    Sig: Take 1 tablet (75 mg total) by mouth 2 (two) times daily.    Dispense:  30 tablet    Refill:  2      Procedures: No procedures performed   Clinical Data: No additional findings.   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  In terms of her left hip she has had constant groin pain with stiffness.  This causes her pain during sex.  She does not have a limp.  She notices there is stiffness as well.  Denies any radicular symptoms.  Right  shoulder pain started back in about 3 months ago when she fell and her arm was jerked backwards.  She is right-hand dominant.  She is worried that she is might have trouble with upcoming qualifications.  She feels a soreness in the shoulder.  Mainly she has a she is reaching backwards behind her back.  She had ultrasound in March which was negative.   Review of Systems  Constitutional: Negative.   HENT: Negative.   Eyes: Negative.   Respiratory: Negative.   Cardiovascular: Negative.   Endocrine: Negative.   Musculoskeletal: Negative.   Neurological: Negative.   Hematological: Negative.   Psychiatric/Behavioral: Negative.   All other systems reviewed and are negative.    Objective: Vital Signs: LMP 03/31/2020   Physical Exam Vitals and nursing note reviewed.  Constitutional:      Appearance: She is well-developed.  HENT:     Head: Normocephalic and atraumatic.  Pulmonary:     Effort: Pulmonary effort is normal.  Abdominal:     Palpations: Abdomen is soft.  Musculoskeletal:     Cervical back: Neck supple.  Skin:    General: Skin is warm.     Capillary Refill: Capillary refill  takes less than 2 seconds.  Neurological:     Mental Status: She is alert and oriented to person, place, and time.  Psychiatric:        Behavior: Behavior normal.        Thought Content: Thought content normal.        Judgment: Judgment normal.     Ortho Exam Left hip shows a painful logroll and internal and external rotation.  No tenderness palpation.  Positive Stinchfield sign.  Negative sciatic tension  Right shoulder shows normal for flexion external rotation.  Internal rotation to the waist.  Manual muscle testing is normal.  Negative crank negative Speed negative cross body adduction.  Negative Hawkins sign. Specialty Comments:  No specialty comments available.  Imaging: XR HIP UNILAT W OR W/O PELVIS 2-3 VIEWS LEFT  Result Date: 04/21/2020 Age advanced degenerative joint disease of  bilateral hips  XR Shoulder Right  Result Date: 04/21/2020 No acute or structural abnormalities    PMFS History: Patient Active Problem List   Diagnosis Date Noted   Fall 03/31/2020   Acute pain of right shoulder 03/31/2020   Fibrocystic disease of right breast 07/02/2018   Well female exam with routine gynecological exam 06/27/2017   Migraine 12/19/2016   Birth control 12/19/2016   Depression, major, single episode, complete remission ([place]) 12/19/2016   Piriformis syndrome of left side 12/19/2016   Past Medical History:  Diagnosis Date   Anxiety     Family History  Problem Relation Age of Onset   Gout Mother    Cancer Mother        melanoma   Hypertension Mother    Diabetes Father    Hypertension Father    Arthritis Father    Heart attack Maternal Grandmother    Heart attack Maternal Grandfather    Diabetes Paternal Grandmother    Arthritis Paternal Grandmother     Past Surgical History:  Procedure Laterality Date   WISDOM TOOTH EXTRACTION     Social History   Occupational History   Not on file  Tobacco Use   Smoking status: Never Smoker   Smokeless tobacco: Never Used  Scientific laboratory technician Use: Never used  Substance and Sexual Activity   Alcohol use: Yes    Comment: occ   Drug use: No   Sexual activity: Not on file

## 2020-06-18 ENCOUNTER — Ambulatory Visit: Payer: PRIVATE HEALTH INSURANCE | Attending: Internal Medicine

## 2020-06-18 DIAGNOSIS — Z23 Encounter for immunization: Secondary | ICD-10-CM

## 2020-06-18 NOTE — Progress Notes (Signed)
° °  Covid-19 Vaccination Clinic  Name:  Michaela Campbell    MRN: [id number] DOB: 1986-01-22  06/18/2020  [name] was observed post Covid-19 immunization for 15 minutes without incident. She was provided with Vaccine Information Sheet and instruction to access the V-Safe system.   [name] was instructed to call 911 with any severe reactions post vaccine:  Difficulty breathing   Swelling of face and throat   A fast heartbeat   A bad rash all over body   Dizziness and weakness   Immunizations Administered    Name Date Dose VIS Date Route   Pfizer COVID-19 Vaccine 06/18/2020  4:19 PM 0.3 mL 12/25/2018 Intramuscular   Manufacturer: [employer], [employer]   Lot: [id number]   NDC: [address]-8

## 2020-07-15 ENCOUNTER — Other Ambulatory Visit: Payer: Self-pay | Admitting: Nurse Practitioner

## 2020-07-15 DIAGNOSIS — F325 Major depressive disorder, single episode, in full remission: Secondary | ICD-10-CM

## 2020-07-23 ENCOUNTER — Ambulatory Visit: Payer: PRIVATE HEALTH INSURANCE

## 2020-08-11 ENCOUNTER — Other Ambulatory Visit: Payer: Self-pay | Admitting: *Deleted

## 2020-08-11 MED ORDER — NORTREL 0.5/35 (28) 0.5-35 MG-MCG PO TABS: 1.0000 | ORAL_TABLET | Freq: Every day | ORAL | 0 refills | Status: DC

## 2020-08-14 ENCOUNTER — Other Ambulatory Visit: Payer: Self-pay | Admitting: Nurse Practitioner

## 2020-08-14 DIAGNOSIS — F325 Major depressive disorder, single episode, in full remission: Secondary | ICD-10-CM

## 2020-08-15 ENCOUNTER — Other Ambulatory Visit: Payer: Self-pay | Admitting: Nurse Practitioner

## 2020-08-15 DIAGNOSIS — F325 Major depressive disorder, single episode, in full remission: Secondary | ICD-10-CM

## 2020-08-17 MED ORDER — SERTRALINE HCL 50 MG PO TABS: 50.0000 mg | ORAL_TABLET | Freq: Every day | ORAL | 0 refills | Status: DC

## 2020-08-17 NOTE — Addendum Note (Signed)
Addended by: Julious Payer D on: 08/17/2020 10:15 AM   Modules accepted: Orders

## 2020-08-17 NOTE — Telephone Encounter (Signed)
E-prescribe down. resent 

## 2020-08-27 ENCOUNTER — Telehealth: Payer: Self-pay | Admitting: *Deleted

## 2020-08-27 ENCOUNTER — Ambulatory Visit (INDEPENDENT_AMBULATORY_CARE_PROVIDER_SITE_OTHER): Payer: PRIVATE HEALTH INSURANCE | Admitting: Nurse Practitioner

## 2020-08-27 ENCOUNTER — Encounter: Payer: Self-pay | Admitting: Nurse Practitioner

## 2020-08-27 ENCOUNTER — Other Ambulatory Visit: Payer: Self-pay | Admitting: Nurse Practitioner

## 2020-08-27 ENCOUNTER — Other Ambulatory Visit: Payer: Self-pay

## 2020-08-27 VITALS — BP 85/57 | HR 62 | Temp 97.8°F | Resp 20 | Ht 68.0 in | Wt 276.0 lb

## 2020-08-27 DIAGNOSIS — G43809 Other migraine, not intractable, without status migrainosus: Secondary | ICD-10-CM

## 2020-08-27 DIAGNOSIS — F325 Major depressive disorder, single episode, in full remission: Secondary | ICD-10-CM | POA: Diagnosis not present

## 2020-08-27 DIAGNOSIS — F411 Generalized anxiety disorder: Secondary | ICD-10-CM | POA: Insufficient documentation

## 2020-08-27 DIAGNOSIS — Z0001 Encounter for general adult medical examination with abnormal findings: Secondary | ICD-10-CM | POA: Diagnosis not present

## 2020-08-27 DIAGNOSIS — Z23 Encounter for immunization: Secondary | ICD-10-CM | POA: Diagnosis not present

## 2020-08-27 DIAGNOSIS — Z0000 Encounter for general adult medical examination without abnormal findings: Secondary | ICD-10-CM

## 2020-08-27 MED ORDER — SUMATRIPTAN SUCCINATE 100 MG PO TABS: ORAL_TABLET | ORAL | 11 refills | Status: DC

## 2020-08-27 MED ORDER — SERTRALINE HCL 50 MG PO TABS: 50.0000 mg | ORAL_TABLET | Freq: Every day | ORAL | 0 refills | Status: DC

## 2020-08-27 MED ORDER — PHENTERMINE HCL 37.5 MG PO TABS: 37.5000 mg | ORAL_TABLET | Freq: Every day | ORAL | 4 refills | Status: DC

## 2020-08-27 NOTE — Patient Instructions (Signed)

## 2020-08-27 NOTE — Telephone Encounter (Signed)
Prior Auth for Phenterimie 37.5mg - In process  Key: [id number]  [facility] is reviewing your PA request. Typically an electronic response will be received within 72 hours. To check for an update later, open this request from your dashboard.  You may close this dialog and return to your dashboard to perform other tasks.

## 2020-08-27 NOTE — Addendum Note (Signed)
Addended by: Cleda Daub on: 08/27/2020 10:39 AM   Modules accepted: Orders

## 2020-08-27 NOTE — Progress Notes (Signed)
Subjective:    Patient ID: Michaela Campbell, female    DOB: 08-22-1986, 34 y.o.   MRN: [id number]   Chief Complaint: Annual Exam (No pap)    HPI:  1. Annual physical exam Last pap was done 08/02/19 which was normal.  2. Other migraine without status migrainosus, not intractable Only have the week prior to menses. She says she can tolerate them.  3. GAD Patient is on zoloft daily and is doing well. GAD 7 : Generalized Anxiety Score 08/27/2020  Nervous, Anxious, on Edge 1  Control/stop worrying 0  Worry too much - different things 0  Trouble relaxing 0  Restless 0  Easily annoyed or irritable 0  Afraid - awful might happen 0  Total GAD 7 Score 1  Anxiety Difficulty Not difficult at all    Depression screen [facility] 2/9 08/27/2020 03/31/2020 03/31/2020  Decreased Interest 0 0 0  Down, Depressed, Hopeless 0 0 0  PHQ - 2 Score 0 0 0  Altered sleeping 0 0 -  Tired, decreased energy 0 3 -  Change in appetite 0 0 -  Feeling bad or failure about yourself  0 0 -  Trouble concentrating 0 0 -  Moving slowly or fidgety/restless 0 0 -  Suicidal thoughts 0 0 -  PHQ-9 Score 0 3 -  Difficult doing work/chores - Not difficult at all -   4. obesity Weight is up 9lbs  Wt Readings from Last 3 Encounters:  08/27/20 276 lb (125.2 kg)  03/31/20 267 lb 9.6 oz (121.4 kg)  08/02/19 261 lb 3.2 oz (118.5 kg)   BMI Readings from Last 3 Encounters:  08/27/20 41.97 kg/m  03/31/20 40.69 kg/m  08/02/19 39.72 kg/m     Outpatient Encounter Medications as of 08/27/2020  Medication Sig  . clobetasol cream (TEMOVATE) 0.05 % Apply 1 application topically 2 (two) times daily.  . diclofenac (VOLTAREN) 75 MG EC tablet Take 1 tablet (75 mg total) by mouth 2 (two) times daily.  . naproxen sodium (ANAPROX) 550 MG tablet Take 1 tablet (550 mg total) by mouth 2 (two) times daily with a meal.  . norethindrone-ethinyl estradiol (NORTREL 0.5/35, 28,) 0.5-35 MG-MCG tablet Take 1 tablet by mouth daily.  .  phentermine 37.5 MG capsule TAKE 1 CAPSULE IN AM  . sertraline (ZOLOFT) 50 MG tablet Take 1 tablet (50 mg total) by mouth daily.  . SUMAtriptan (IMITREX) 100 MG tablet 1 TAB EVERY 2 HOURS AS NEEDED FOR MIGRAINE MAY REPEAT IN 2 HOURS IF HEADACHE PERSISTS OR RECURS  . [DISCONTINUED] cyclobenzaprine (FLEXERIL) 5 MG tablet Take 1 tablet (5 mg total) by mouth 3 (three) times daily as needed for muscle spasms.  . [DISCONTINUED] predniSONE (STERAPRED UNI-PAK 21 TAB) 10 MG (21) TBPK tablet 60 mg by mouth day 1, 50 day 2, 40 day 3, 30 day 4, 20 day 5, 10 day 6     Past Surgical History:  Procedure Laterality Date  . WISDOM TOOTH EXTRACTION      Family History  Problem Relation Age of Onset  . Gout Mother   . Cancer Mother        melanoma  . Hypertension Mother   . Diabetes Father   . Hypertension Father   . Arthritis Father   . Heart attack Maternal Grandmother   . Heart attack Maternal Grandfather   . Diabetes Paternal Grandmother   . Arthritis Paternal Grandmother     New complaints: None today  Social history: Lives with husband. They  has custody of grandchildren  Controlled substance contract: n/a    Review of Systems  Constitutional: Negative for diaphoresis.  Eyes: Negative for pain.  Respiratory: Negative for shortness of breath.   Cardiovascular: Negative for chest pain, palpitations and leg swelling.  Gastrointestinal: Negative for abdominal pain.  Endocrine: Negative for polydipsia.  Skin: Negative for rash.  Neurological: Negative for dizziness, weakness and headaches.  Hematological: Does not bruise/bleed easily.  All other systems reviewed and are negative.      Objective:   Physical Exam Vitals and nursing note reviewed.  Constitutional:      General: She is not in acute distress.    Appearance: Normal appearance. She is well-developed.  HENT:     Head: Normocephalic.     Nose: Nose normal.  Eyes:     Pupils: Pupils are equal, round, and reactive to  light.  Neck:     Vascular: No carotid bruit or JVD.  Cardiovascular:     Rate and Rhythm: Normal rate and regular rhythm.     Heart sounds: Normal heart sounds.  Pulmonary:     Effort: Pulmonary effort is normal. No respiratory distress.     Breath sounds: Normal breath sounds. No wheezing or rales.  Chest:     Chest wall: No tenderness.  Abdominal:     General: Bowel sounds are normal. There is no distension or abdominal bruit.     Palpations: Abdomen is soft. There is no hepatomegaly, splenomegaly, mass or pulsatile mass.     Tenderness: There is no abdominal tenderness.  Musculoskeletal:        General: Normal range of motion.     Cervical back: Normal range of motion and neck supple.  Lymphadenopathy:     Cervical: No cervical adenopathy.  Skin:    General: Skin is warm and dry.  Neurological:     Mental Status: She is alert and oriented to person, place, and time.     Deep Tendon Reflexes: Reflexes are normal and symmetric.  Psychiatric:        Behavior: Behavior normal.        Thought Content: Thought content normal.        Judgment: Judgment normal.       BP (!) 85/57   Pulse 62   Temp 97.8 F (36.6 C) (Temporal)   Resp 20   Ht 5\' 8"  (1.727 m)   Wt 276 lb (125.2 kg)   BMI 41.97 kg/m      Assessment & Plan:  Michaela Campbell comes in today with chief complaint of Annual Exam (No pap)   Diagnosis and orders addressed:  1. Annual physical exam  2. Other migraine without status migrainosus, not intractable Avoid caffeine - SUMAtriptan (IMITREX) 100 MG tablet; 1 TAB EVERY 2 HOURS AS NEEDED FOR MIGRAINE MAY REPEAT IN 2 HOURS IF HEADACHE PERSISTS OR RECURS  Dispense: 10 tablet; Refill: 11  3. GAD (generalized anxiety disorder) Stress management - sertraline (ZOLOFT) 50 MG tablet; Take 1 tablet (50 mg total) by mouth daily.  Dispense: 90 tablet; Refill: 0  4. Morbid obesity (HCC) Discussed diet and exercise for person with BMI >25 Will recheck weight in  3-6 months - phentermine (ADIPEX-P) 37.5 MG tablet; Take 1 tablet (37.5 mg total) by mouth daily before breakfast.  Dispense: 30 tablet; Refill: 4   Labs pending Health Maintenance reviewed Diet and exercise encouraged  Follow up plan: 6 months   Mary-Margaret Ramiro Harvest, FNP

## 2020-08-28 LAB — CMP14+EGFR
ALT: 16 IU/L (ref 0–32)
AST: 18 IU/L (ref 0–40)
Albumin/Globulin Ratio: 1.3 (ref 1.2–2.2)
Albumin: 3.8 g/dL (ref 3.8–4.8)
Alkaline Phosphatase: 66 IU/L (ref 44–121)
BUN/Creatinine Ratio: 10 (ref 9–23)
BUN: 8 mg/dL (ref 6–20)
Bilirubin Total: 0.2 mg/dL (ref 0.0–1.2)
CO2: 24 mmol/L (ref 20–29)
Calcium: 9.4 mg/dL (ref 8.7–10.2)
Chloride: 101 mmol/L (ref 96–106)
Creatinine, Ser: 0.81 mg/dL (ref 0.57–1.00)
GFR calc Af Amer: 108 mL/min/{1.73_m2} (ref >59)
GFR calc non Af Amer: 94 mL/min/{1.73_m2} (ref >59)
Globulin, Total: 3 g/dL (ref 1.5–4.5)
Glucose: 85 mg/dL (ref 65–99)
Potassium: 4.1 mmol/L (ref 3.5–5.2)
Sodium: 136 mmol/L (ref 134–144)
Total Protein: 6.8 g/dL (ref 6.0–8.5)

## 2020-08-28 LAB — LIPID PANEL
Chol/HDL Ratio: 3.2 ratio (ref 0.0–4.4)
Cholesterol, Total: 180 mg/dL (ref 100–199)
HDL: 57 mg/dL (ref >39)
LDL Chol Calc (NIH): 101 mg/dL — ABNORMAL HIGH (ref 0–99)
Triglycerides: 126 mg/dL (ref 0–149)
VLDL Cholesterol Cal: 22 mg/dL (ref 5–40)

## 2020-08-28 LAB — CBC WITH DIFFERENTIAL/PLATELET
Basophils Absolute: 0 10*3/uL (ref 0.0–0.2)
Basos: 0 %
EOS (ABSOLUTE): 0.2 10*3/uL (ref 0.0–0.4)
Eos: 2 %
Hematocrit: 39.8 % (ref 34.0–46.6)
Hemoglobin: 12.9 g/dL (ref 11.1–15.9)
Immature Grans (Abs): 0 10*3/uL (ref 0.0–0.1)
Immature Granulocytes: 0 %
Lymphocytes Absolute: 2.2 10*3/uL (ref 0.7–3.1)
Lymphs: 27 %
MCH: 28.3 pg (ref 26.6–33.0)
MCHC: 32.4 g/dL (ref 31.5–35.7)
MCV: 87 fL (ref 79–97)
Monocytes Absolute: 0.5 10*3/uL (ref 0.1–0.9)
Monocytes: 7 %
Neutrophils Absolute: 5.3 10*3/uL (ref 1.4–7.0)
Neutrophils: 64 %
Platelets: 238 10*3/uL (ref 150–450)
RBC: 4.56 x10E6/uL (ref 3.77–5.28)
RDW: 12.4 % (ref 11.7–15.4)
WBC: 8.2 10*3/uL (ref 3.4–10.8)

## 2020-08-28 LAB — THYROID PANEL WITH TSH
Free Thyroxine Index: 1.3 (ref 1.2–4.9)
T3 Uptake Ratio: 15 % — ABNORMAL LOW (ref 24–39)
T4, Total: 8.9 ug/dL (ref 4.5–12.0)
TSH: 1.49 u[IU]/mL (ref 0.450–4.500)

## 2020-08-28 NOTE — Telephone Encounter (Signed)
Prior Auth for Phenterimie 37.5mg - APPROVED  Through 02/25/2021  Key: [facility]  Pharmacy aware

## 2020-09-06 ENCOUNTER — Other Ambulatory Visit: Payer: Self-pay | Admitting: Nurse Practitioner

## 2020-11-14 ENCOUNTER — Other Ambulatory Visit: Payer: Self-pay | Admitting: Nurse Practitioner

## 2020-12-06 ENCOUNTER — Other Ambulatory Visit: Payer: Self-pay | Admitting: Nurse Practitioner

## 2021-01-11 ENCOUNTER — Other Ambulatory Visit: Payer: Self-pay | Admitting: Nurse Practitioner

## 2021-01-17 ENCOUNTER — Other Ambulatory Visit: Payer: Self-pay | Admitting: Nurse Practitioner

## 2021-01-17 DIAGNOSIS — F411 Generalized anxiety disorder: Secondary | ICD-10-CM

## 2021-02-22 ENCOUNTER — Other Ambulatory Visit: Payer: Self-pay

## 2021-02-22 ENCOUNTER — Ambulatory Visit (INDEPENDENT_AMBULATORY_CARE_PROVIDER_SITE_OTHER): Payer: PRIVATE HEALTH INSURANCE | Admitting: Nurse Practitioner

## 2021-02-22 ENCOUNTER — Encounter: Payer: Self-pay | Admitting: Nurse Practitioner

## 2021-02-22 VITALS — BP 110/62 | HR 80 | Temp 98.2°F | Resp 20 | Ht 68.0 in | Wt 266.0 lb

## 2021-02-22 DIAGNOSIS — F411 Generalized anxiety disorder: Secondary | ICD-10-CM | POA: Diagnosis not present

## 2021-02-22 DIAGNOSIS — G43809 Other migraine, not intractable, without status migrainosus: Secondary | ICD-10-CM

## 2021-02-22 LAB — CMP14+EGFR
ALT: 16 IU/L (ref 0–32)
AST: 20 IU/L (ref 0–40)
Albumin/Globulin Ratio: 1.5 (ref 1.2–2.2)
Albumin: 4.1 g/dL (ref 3.8–4.8)
Alkaline Phosphatase: 65 IU/L (ref 44–121)
BUN/Creatinine Ratio: 11 (ref 9–23)
BUN: 10 mg/dL (ref 6–20)
Bilirubin Total: 0.3 mg/dL (ref 0.0–1.2)
CO2: 22 mmol/L (ref 20–29)
Calcium: 9.2 mg/dL (ref 8.7–10.2)
Chloride: 103 mmol/L (ref 96–106)
Creatinine, Ser: 0.9 mg/dL (ref 0.57–1.00)
Globulin, Total: 2.7 g/dL (ref 1.5–4.5)
Glucose: 72 mg/dL (ref 65–99)
Potassium: 4.3 mmol/L (ref 3.5–5.2)
Sodium: 141 mmol/L (ref 134–144)
Total Protein: 6.8 g/dL (ref 6.0–8.5)
eGFR: 85 mL/min/{1.73_m2} (ref >59)

## 2021-02-22 LAB — LIPID PANEL
Chol/HDL Ratio: 3.3 ratio (ref 0.0–4.4)
Cholesterol, Total: 184 mg/dL (ref 100–199)
HDL: 55 mg/dL (ref >39)
LDL Chol Calc (NIH): 105 mg/dL — ABNORMAL HIGH (ref 0–99)
Triglycerides: 139 mg/dL (ref 0–149)
VLDL Cholesterol Cal: 24 mg/dL (ref 5–40)

## 2021-02-22 MED ORDER — SERTRALINE HCL 50 MG PO TABS: 1.0000 | ORAL_TABLET | Freq: Every day | ORAL | 1 refills | Status: DC

## 2021-02-22 MED ORDER — PHENTERMINE HCL 37.5 MG PO TABS: 37.5000 mg | ORAL_TABLET | Freq: Every day | ORAL | 4 refills | Status: DC

## 2021-02-22 MED ORDER — SUMATRIPTAN SUCCINATE 100 MG PO TABS: ORAL_TABLET | ORAL | 11 refills | Status: DC

## 2021-02-22 NOTE — Patient Instructions (Signed)
Textbook of family medicine (9th ed., pp. 1062-1073). [place], [place]: [employer].">  Stress, Adult Stress is a normal reaction to life events. Stress is what you feel when life demands more than you are used to, or more than you think you can handle. Some stress can be useful, such as studying for a test or meeting a deadline at work. Stress that occurs too often or for too long can cause problems. It can affect your emotional health and interfere with relationships and normal daily activities. Too much stress can weaken your body's defense system (immune system) and increase your risk for physical illness. If you already have a medical problem, stress can make it worse. What are the causes? All sorts of life events can cause stress. An event that causes stress for one person may not be stressful for another person. Major life events, whether positive or negative, commonly cause stress. Examples include:  Losing a job or starting a new job.  Losing a loved one.  Moving to a new town or home.  Getting married or divorced.  Having a baby.  Getting injured or sick. Less obvious life events can also cause stress, especially if they occur day after day or in combination with each other. Examples include:  Working long hours.  Driving in traffic.  Caring for children.  Being in debt.  Being in a difficult relationship. What are the signs or symptoms? Stress can cause emotional symptoms, including:  Anxiety. This is feeling worried, afraid, on edge, overwhelmed, or out of control.  Anger, including irritation or impatience.  Depression. This is feeling sad, down, helpless, or guilty.  Trouble focusing, remembering, or making decisions. Stress can cause physical symptoms, including:  Aches and pains. These may affect your head, neck, back, stomach, or other areas of your body.  Tight muscles or a clenched jaw.  Low energy.  Trouble sleeping. Stress can cause unhealthy  behaviors, including:  Eating to feel better (overeating) or skipping meals.  Working too much or putting off tasks.  Smoking, drinking alcohol, or using drugs to feel better. How is this diagnosed? Stress is diagnosed through an assessment by your health care provider. He or she may diagnose this condition based on:  Your symptoms and any stressful life events.  Your medical history.  Tests to rule out other causes of your symptoms. Depending on your condition, your health care provider may refer you to a specialist for further evaluation. How is this treated? Stress management techniques are the recommended treatment for stress. Medicine is not typically recommended for the treatment of stress. Techniques to reduce your reaction to stressful life events include:  Stress identification. Monitor yourself for symptoms of stress and identify what causes stress for you. These skills may help you to avoid or prepare for stressful events.  Time management. Set your priorities, keep a calendar of events, and learn to say no. Taking these actions can help you avoid making too many commitments. Techniques for coping with stress include:  Rethinking the problem. Try to think realistically about stressful events rather than ignoring them or overreacting. Try to find the positives in a stressful situation rather than focusing on the negatives.  Exercise. Physical exercise can release both physical and emotional tension. The key is to find a form of exercise that you enjoy and do it regularly.  Relaxation techniques. These relax the body and mind. The key is to find one or more that you enjoy and use the techniques regularly. Examples include: ?   Meditation, deep breathing, or progressive relaxation techniques. ? Yoga or tai chi. ? Biofeedback, mindfulness techniques, or journaling. ? Listening to music, being out in nature, or participating in other hobbies.  Practicing a healthy lifestyle.  Eat a balanced diet, drink plenty of water, limit or avoid caffeine, and get plenty of sleep.  Having a strong support network. Spend time with family, friends, or other people you enjoy being around. Express your feelings and talk things over with someone you trust. Counseling or talk therapy with a mental health professional may be helpful if you are having trouble managing stress on your own.   Follow these instructions at home: Lifestyle  Avoid drugs.  Do not use any products that contain nicotine or tobacco, such as cigarettes, e-cigarettes, and chewing tobacco. If you need help quitting, ask your health care provider.  Limit alcohol intake to no more than 1 drink a day for nonpregnant women and 2 drinks a day for men. One drink equals 12 oz of beer, 5 oz of wine, or 1 oz of hard liquor  Do not use alcohol or drugs to relax.  Eat a balanced diet that includes fresh fruits and vegetables, whole grains, lean meats, fish, eggs, and beans, and low-fat dairy. Avoid processed foods and foods high in added fat, sugar, and salt.  Exercise at least 30 minutes on 5 or more days each week.  Get 7-8 hours of sleep each night.   General instructions  Practice stress management techniques as discussed with your health care provider.  Drink enough fluid to keep your urine clear or pale yellow.  Take over-the-counter and prescription medicines only as told by your health care provider.  Keep all follow-up visits as told by your health care provider. This is important.   Contact a health care provider if:  Your symptoms get worse.  You have new symptoms.  You feel overwhelmed by your problems and can no longer manage them on your own. Get help right away if:  You have thoughts of hurting yourself or others. If you ever feel like you may hurt yourself or others, or have thoughts about taking your own life, get help right away. You can go to your nearest emergency department or  call:  Your local emergency services (911 in the [place]  A suicide crisis helpline, such as the National Suicide Prevention Lifeline at 1-800-273-8255. This is open 24 hours a day. Summary  Stress is a normal reaction to life events. It can cause problems if it happens too often or for too long.  Practicing stress management techniques is the best way to treat stress.  Counseling or talk therapy with a mental health professional may be helpful if you are having trouble managing stress on your own. This information is not intended to replace advice given to you by your health care provider. Make sure you discuss any questions you have with your health care provider. Document Revised: 07/03/2020 Document Reviewed: 07/03/2020 Elsevier Patient Education  2021 Elsevier Inc.  

## 2021-02-22 NOTE — Progress Notes (Signed)
Subjective:    Patient ID: Michaela Campbell, female    DOB: [birth year], 35 y.o.   MRN: [id number]   Chief Complaint: medical management of chronic issues     HPI:  1. GAD (generalized anxiety disorder) Is on zoloft 50mg  and is doing well. Depression screen [facility] 2/9 02/22/2021 02/22/2021 08/27/2020  Decreased Interest 0 0 0  Down, Depressed, Hopeless 0 0 0  PHQ - 2 Score 0 0 0  Altered sleeping 0 - 0  Tired, decreased energy 0 - 0  Change in appetite 0 - 0  Feeling bad or failure about yourself  0 - 0  Trouble concentrating 0 - 0  Moving slowly or fidgety/restless 0 - 0  Suicidal thoughts 0 - 0  PHQ-9 Score 0 - 0  Difficult doing work/chores Not difficult at all - -   GAD 7 : Generalized Anxiety Score 08/27/2020  Nervous, Anxious, on Edge 1  Control/stop worrying 0  Worry too much - different things 0  Trouble relaxing 0  Restless 0  Easily annoyed or irritable 0  Afraid - awful might happen 0  Total GAD 7 Score 1  Anxiety Difficulty Not difficult at all      2. Other migraine without status migrainosus, not intractable Only has week before her menses. Is dong well. With imitrex.  3. Obesity Weight is down 10lbs Wt Readings from Last 3 Encounters:  02/22/21 266 lb (120.7 kg)  08/27/20 276 lb (125.2 kg)  03/31/20 267 lb 9.6 oz (121.4 kg)   BMI Readings from Last 3 Encounters:  02/22/21 40.45 kg/m  08/27/20 41.97 kg/m  03/31/20 40.69 kg/m         Outpatient Encounter Medications as of 02/22/2021  Medication Sig  . clobetasol cream (TEMOVATE) 0.05 % Apply 1 application topically 2 (two) times daily.  . diclofenac (VOLTAREN) 75 MG EC tablet TAKE 1 TABLET BY MOUTH TWICE A DAY  . naproxen sodium (ANAPROX) 550 MG tablet Take 1 tablet (550 mg total) by mouth 2 (two) times daily with a meal.  . NORTREL 0.5/35, 28, 0.5-35 MG-MCG tablet TAKE 1 TABLET BY MOUTH EVERY DAY  . phentermine (ADIPEX-P) 37.5 MG tablet Take 1 tablet (37.5 mg total) by mouth daily before  breakfast.  . sertraline (ZOLOFT) 50 MG tablet TAKE 1 TABLET BY MOUTH EVERY DAY  . SUMAtriptan (IMITREX) 100 MG tablet 1 TAB EVERY 2 HOURS AS NEEDED FOR MIGRAINE MAY REPEAT IN 2 HOURS IF HEADACHE PERSISTS OR RECURS   No facility-administered encounter medications on file as of 02/22/2021.    Past Surgical History:  Procedure Laterality Date  . WISDOM TOOTH EXTRACTION      Family History  Problem Relation Age of Onset  . Gout Mother   . Cancer Mother        melanoma  . Hypertension Mother   . Diabetes Father   . Hypertension Father   . Arthritis Father   . Heart attack Maternal Grandmother   . Heart attack Maternal Grandfather   . Diabetes Paternal Grandmother   . Arthritis Paternal Grandmother     New complaints: None today  Social history: Lives with husband and is raising her step daughters children  Controlled substance contract: n/a    Review of Systems  Constitutional: Negative for diaphoresis.  Eyes: Negative for pain.  Respiratory: Negative for shortness of breath.   Cardiovascular: Negative for chest pain, palpitations and leg swelling.  Gastrointestinal: Negative for abdominal pain.  Endocrine: Negative for polydipsia.  Skin: Negative for rash.  Neurological: Negative for dizziness, weakness and headaches.  Hematological: Does not bruise/bleed easily.  All other systems reviewed and are negative.      Objective:   Physical Exam Vitals and nursing note reviewed.  Constitutional:      General: She is not in acute distress.    Appearance: Normal appearance. She is well-developed.  HENT:     Head: Normocephalic.     Nose: Nose normal.  Eyes:     Pupils: Pupils are equal, round, and reactive to light.  Neck:     Vascular: No carotid bruit or JVD.  Cardiovascular:     Rate and Rhythm: Normal rate and regular rhythm.     Heart sounds: Normal heart sounds.  Pulmonary:     Effort: Pulmonary effort is normal. No respiratory distress.     Breath  sounds: Normal breath sounds. No wheezing or rales.  Chest:     Chest wall: No tenderness.  Abdominal:     General: Bowel sounds are normal. There is no distension or abdominal bruit.     Palpations: Abdomen is soft. There is no hepatomegaly, splenomegaly, mass or pulsatile mass.     Tenderness: There is no abdominal tenderness.  Musculoskeletal:        General: Normal range of motion.     Cervical back: Normal range of motion and neck supple.  Lymphadenopathy:     Cervical: No cervical adenopathy.  Skin:    General: Skin is warm and dry.  Neurological:     Mental Status: She is alert and oriented to person, place, and time.     Deep Tendon Reflexes: Reflexes are normal and symmetric.  Psychiatric:        Behavior: Behavior normal.        Thought Content: Thought content normal.        Judgment: Judgment normal.    BP 110/62   Pulse 80   Temp 98.2 F (36.8 C) (Temporal)   Resp 20   Ht 5\' 8"  (1.727 m)   Wt 266 lb (120.7 kg)   SpO2 97%   BMI 40.45 kg/m         Assessment & Plan:  BENIGNA DELISI comes in today with chief complaint of Medical Management of Chronic Issues   Diagnosis and orders addressed:  1. GAD (generalized anxiety disorder) Stress management - sertraline (ZOLOFT) 50 MG tablet; Take 1 tablet (50 mg total) by mouth daily.  Dispense: 90 tablet; Refill: 1  2. Other migraine without status migrainosus, not intractable - SUMAtriptan (IMITREX) 100 MG tablet; 1 TAB EVERY 2 HOURS AS NEEDED FOR MIGRAINE MAY REPEAT IN 2 HOURS IF HEADACHE PERSISTS OR RECURS  Dispense: 10 tablet; Refill: 11  3. Morbid obesity (HCC) Discussed diet and exercise for person with BMI >25 Will recheck weight in 3-6 months - phentermine (ADIPEX-P) 37.5 MG tablet; Take 1 tablet (37.5 mg total) by mouth daily before breakfast.  Dispense: 30 tablet; Refill: 4   Labs pending Health Maintenance reviewed Diet and exercise encouraged  Follow up plan: 6 months   Mary-Margaret  Ramiro Harvest, FNP

## 2021-05-04 IMAGING — US US EXTREM UP *R* LTD
1 series · 8 of 8 positions shown · non-contrast
Comparison: None

CLINICAL DATA: Fell down stairs while holding onto a railing 4
weeks ago, pain at medial upper arm increased when trying to reach
behind back

EXAM:
ULTRASOUND RIGHT UPPER EXTREMITY LIMITED
TECHNIQUE: Ultrasound examination of the upper extremity soft tissues was
performed in the area of clinical concern.

[Series 1: us soft tissue right upper extremity limited (non- · 8 acquisitions, 8 frames shown]
[im 1/8]
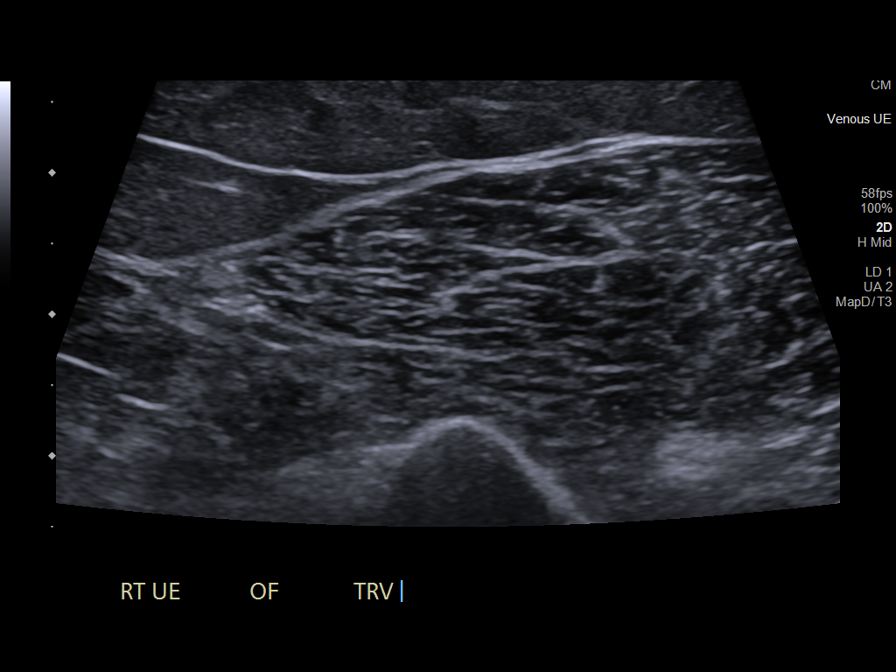
[im 2/8]
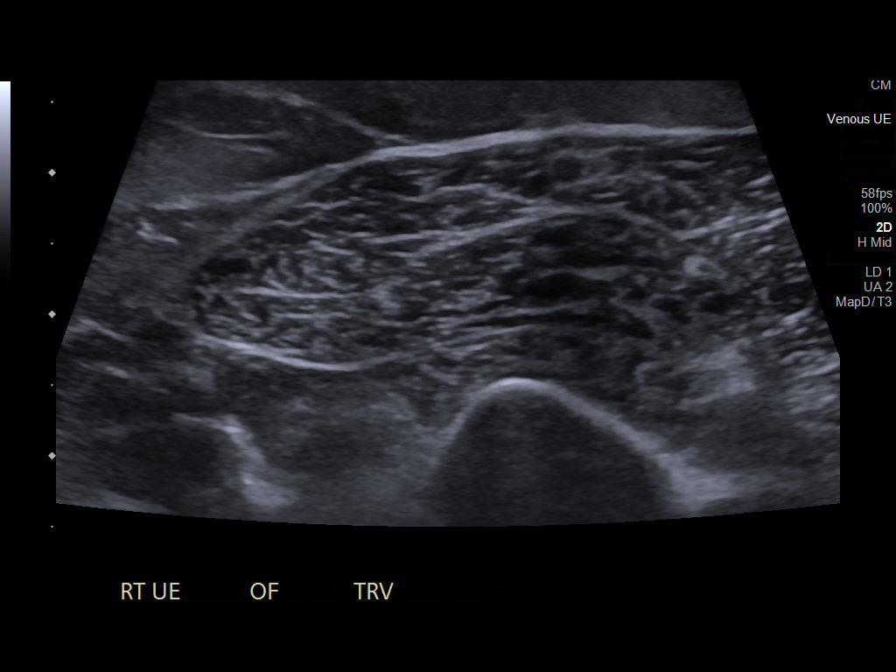
[im 3/8]
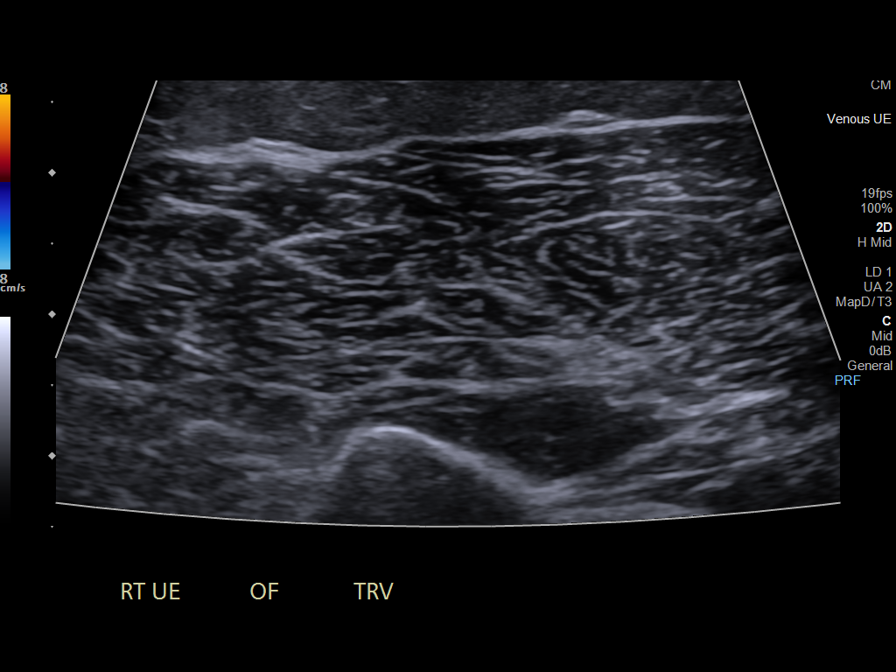
[im 4/8]
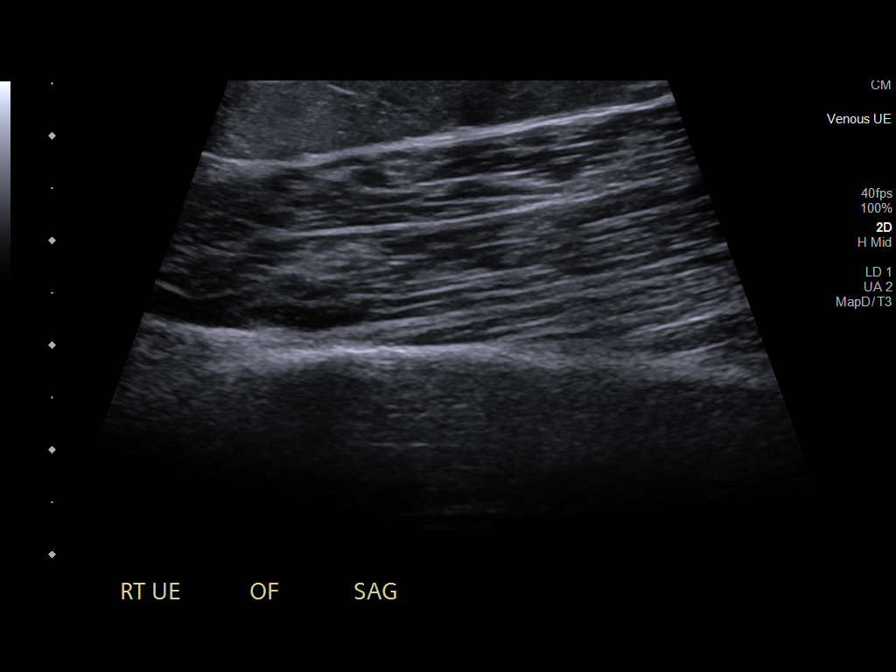
[im 5/8]
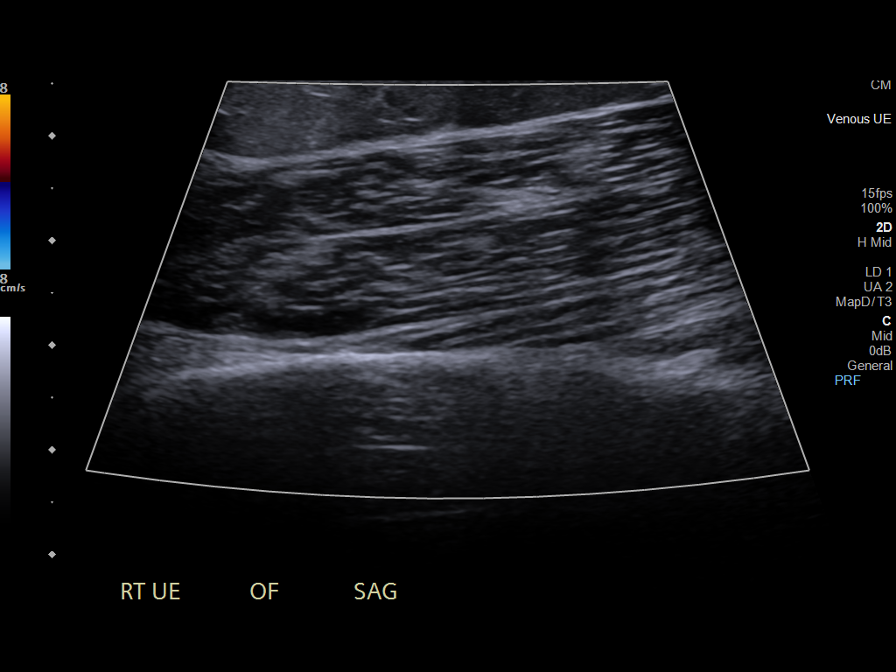
[im 6/8]
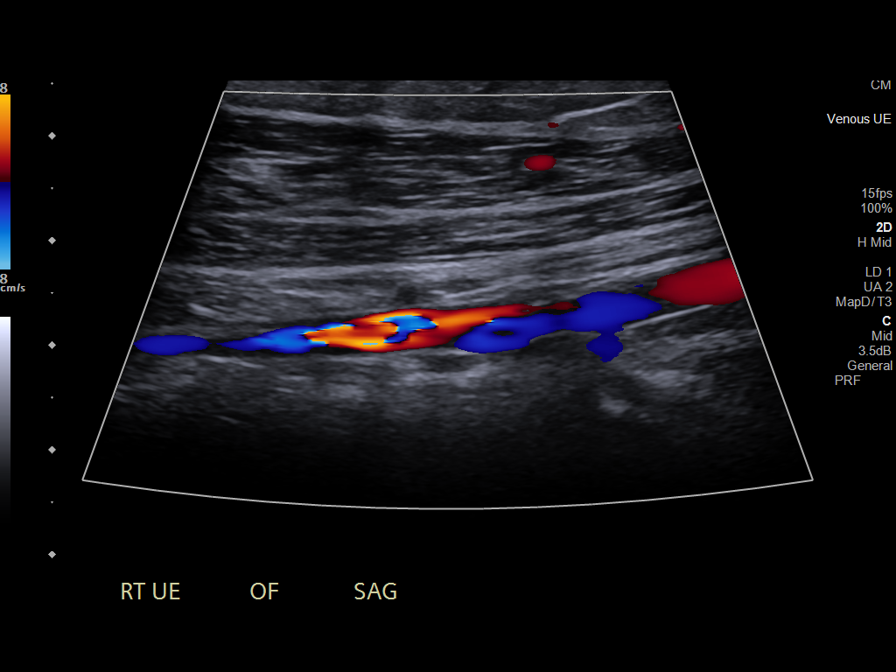
[im 7/8]
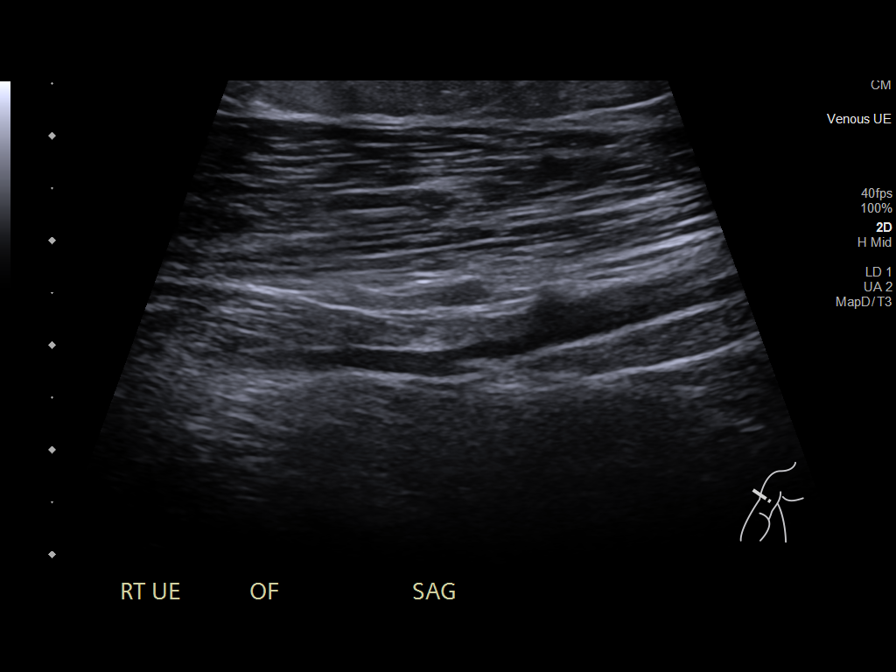
[im 8/8]
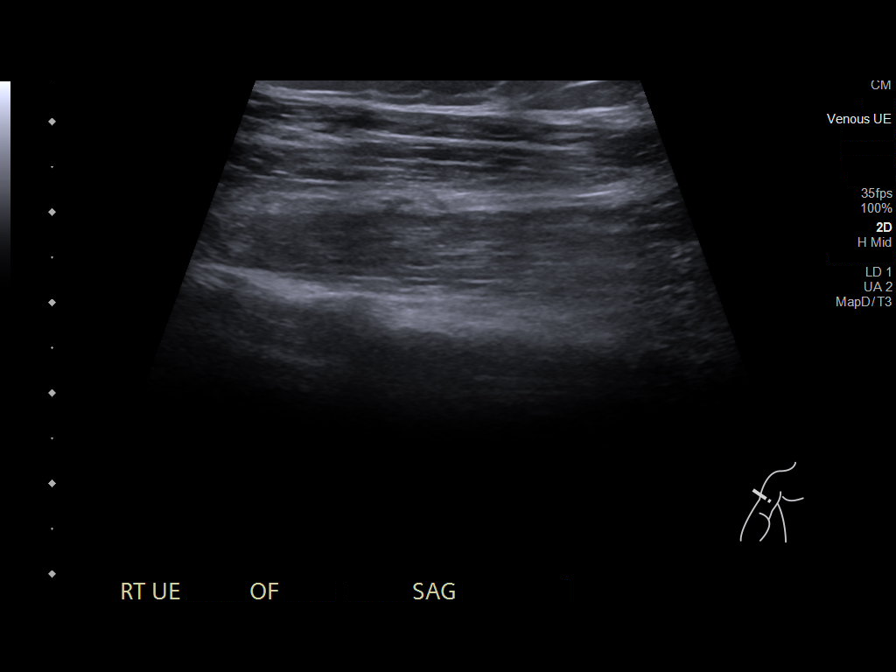

[8 of 8 positions shown; findings below may reference images not displayed]

FINDINGS: Normal appearance of subcutaneous soft tissues.

No hematoma, mass or abnormal fluid collection identified.

Muscular planes involving the RIGHT biceps and RIGHT brachialis
muscles demonstrate normal sonographic appearances.

No definite focus of muscular injury is identified.

No intramuscular hematoma or fluid.
IMPRESSION: Negative ultrasound of the site of clinical concern at the muscles
at the medial upper RIGHT arm.

If patient has persistent symptoms, this could be further assessed
by MR imaging to evaluate for occult intramuscular injury.

## 2021-05-10 ENCOUNTER — Other Ambulatory Visit: Payer: Self-pay | Admitting: Nurse Practitioner

## 2021-07-22 ENCOUNTER — Other Ambulatory Visit: Payer: Self-pay | Admitting: Nurse Practitioner

## 2021-08-09 ENCOUNTER — Other Ambulatory Visit: Payer: Self-pay | Admitting: Nurse Practitioner

## 2021-08-09 NOTE — Telephone Encounter (Signed)
CPE 08/31/21

## 2021-08-31 ENCOUNTER — Encounter: Payer: Self-pay | Admitting: Nurse Practitioner

## 2021-08-31 ENCOUNTER — Other Ambulatory Visit: Payer: Self-pay

## 2021-08-31 ENCOUNTER — Ambulatory Visit (INDEPENDENT_AMBULATORY_CARE_PROVIDER_SITE_OTHER): Payer: No Typology Code available for payment source | Admitting: Nurse Practitioner

## 2021-08-31 VITALS — BP 115/79 | HR 90 | Temp 96.6°F | Resp 20 | Ht 68.0 in | Wt 276.0 lb

## 2021-08-31 DIAGNOSIS — Z0001 Encounter for general adult medical examination with abnormal findings: Secondary | ICD-10-CM | POA: Diagnosis not present

## 2021-08-31 DIAGNOSIS — G43809 Other migraine, not intractable, without status migrainosus: Secondary | ICD-10-CM | POA: Diagnosis not present

## 2021-08-31 DIAGNOSIS — F411 Generalized anxiety disorder: Secondary | ICD-10-CM

## 2021-08-31 DIAGNOSIS — G5702 Lesion of sciatic nerve, left lower limb: Secondary | ICD-10-CM

## 2021-08-31 DIAGNOSIS — Z0000 Encounter for general adult medical examination without abnormal findings: Secondary | ICD-10-CM

## 2021-08-31 MED ORDER — NORTREL 0.5/35 (28) 0.5-35 MG-MCG PO TABS: 1.0000 | ORAL_TABLET | Freq: Every day | ORAL | 3 refills | Status: DC

## 2021-08-31 MED ORDER — SERTRALINE HCL 50 MG PO TABS: 50.0000 mg | ORAL_TABLET | Freq: Every day | ORAL | 1 refills | Status: DC

## 2021-08-31 MED ORDER — SUMATRIPTAN SUCCINATE 100 MG PO TABS: ORAL_TABLET | ORAL | 11 refills | Status: DC

## 2021-08-31 MED ORDER — CLOBETASOL PROPIONATE 0.05 % EX CREA: 1.0000 "application " | TOPICAL_CREAM | Freq: Two times a day (BID) | CUTANEOUS | 1 refills | Status: DC

## 2021-08-31 NOTE — Patient Instructions (Signed)
Exercising to Stay Healthy °To become healthy and stay healthy, it is recommended that you do moderate-intensity and vigorous-intensity exercise. You can tell that you are exercising at a moderate intensity if your heart starts beating faster and you start breathing faster but can still hold a conversation. You can tell that you are exercising at a vigorous intensity if you are breathing much harder and faster and cannot hold a conversation while exercising. °How can exercise benefit me? °Exercising regularly is important. It has many health benefits, such as: °Improving overall fitness, flexibility, and endurance. °Increasing bone density. °Helping with weight control. °Decreasing body fat. °Increasing muscle strength and endurance. °Reducing stress and tension, anxiety, depression, or anger. °Improving overall health. °What guidelines should I follow while exercising? °Before you start a new exercise program, talk with your health care provider. °Do not exercise so much that you hurt yourself, feel dizzy, or get very short of breath. °Wear comfortable clothes and wear shoes with good support. °Drink plenty of water while you exercise to prevent dehydration or heat stroke. °Work out until your breathing and your heartbeat get faster (moderate intensity). °How often should I exercise? °Choose an activity that you enjoy, and set realistic goals. Your health care provider can help you make an activity plan that is individually designed and works best for you. °Exercise regularly as told by your health care provider. This may include: °Doing strength training two times a week, such as: °Lifting weights. °Using resistance bands. °Push-ups. °Sit-ups. °Yoga. °Doing a certain intensity of exercise for a given amount of time. Choose from these options: °A total of 150 minutes of moderate-intensity exercise every week. °A total of 75 minutes of vigorous-intensity exercise every week. °A mix of moderate-intensity and  vigorous-intensity exercise every week. °Children, pregnant women, people who have not exercised regularly, people who are overweight, and older adults may need to talk with a health care provider about what activities are safe to perform. If you have a medical condition, be sure to talk with your health care provider before you start a new exercise program. °What are some exercise ideas? °Moderate-intensity exercise ideas include: °Walking 1 mile (1.6 km) in about 15 minutes. °Biking. °Hiking. °Golfing. °Dancing. °Water aerobics. °Vigorous-intensity exercise ideas include: °Walking 4.5 miles (7.2 km) or more in about 1 hour. °Jogging or running 5 miles (8 km) in about 1 hour. °Biking 10 miles (16.1 km) or more in about 1 hour. °Lap swimming. °Roller-skating or in-line skating. °Cross-country skiing. °Vigorous competitive sports, such as football, basketball, and soccer. °Jumping rope. °Aerobic dancing. °What are some everyday activities that can help me get exercise? °Yard work, such as: °Pushing a lawn mower. °Raking and bagging leaves. °Washing your car. °Pushing a stroller. °Shoveling snow. °Gardening. °Washing windows or floors. °How can I be more active in my day-to-day activities? °Use stairs instead of an elevator. °Take a walk during your lunch break. °If you drive, park your car farther away from your work or school. °If you take public transportation, get off one stop early and walk the rest of the way. °Stand up or walk around during all of your indoor phone calls. °Get up, stretch, and walk around every 30 minutes throughout the day. °Enjoy exercise with a friend. Support to continue exercising will help you keep a regular routine of activity. °Where to find more information °You can find more information about exercising to stay healthy from: °[place] Department of Health and Human Services: www.hhs.gov °Centers for Disease Control and Prevention (  CDC): www.cdc.gov °Summary °Exercising regularly is  important. It will improve your overall fitness, flexibility, and endurance. °Regular exercise will also improve your overall health. It can help you control your weight, reduce stress, and improve your bone density. °Do not exercise so much that you hurt yourself, feel dizzy, or get very short of breath. °Before you start a new exercise program, talk with your health care provider. °This information is not intended to replace advice given to you by your health care provider. Make sure you discuss any questions you have with your health care provider. °Document Revised: 02/12/2021 Document Reviewed: 02/12/2021 °Elsevier Patient Education © 2022 Elsevier Inc. ° °

## 2021-08-31 NOTE — Progress Notes (Signed)
Subjective:    Patient ID: Michaela Campbell, female    DOB: [birth year], 35 y.o.   MRN: [id number]   Chief Complaint: annual physical exam   HPI:  1. Annual physical exam No pap- due next year. LMP 08/30/21  2. Other migraine without status migrainosus, not intractable Occur about 1-2 x just prior to menses. Uses imitrex which helps.  3. Piriformis syndrome of left side Patient says she does not have this. Not sure why this os on her chart.  4. GAD (generalized anxiety disorder) Is on zoloft and she says that works well. GAD 7 : Generalized Anxiety Score 08/31/2021 08/27/2020  Nervous, Anxious, on Edge 0 1  Control/stop worrying 0 0  Worry too much - different things 0 0  Trouble relaxing 0 0  Restless 0 0  Easily annoyed or irritable 0 0  Afraid - awful might happen 0 0  Total GAD 7 Score 0 1  Anxiety Difficulty Not difficult at all Not difficult at all        Outpatient Encounter Medications as of 08/31/2021  Medication Sig   clobetasol cream (TEMOVATE) 3.74 % Apply 1 application topically 2 (two) times daily.   diclofenac (VOLTAREN) 75 MG EC tablet TAKE 1 TABLET BY MOUTH TWICE A DAY   NORTREL 0.5/35, 28, 0.5-35 MG-MCG tablet TAKE 1 TABLET BY MOUTH EVERY DAY   phentermine (ADIPEX-P) 37.5 MG tablet Take 1 tablet (37.5 mg total) by mouth daily before breakfast.   sertraline (ZOLOFT) 50 MG tablet Take 1 tablet (50 mg total) by mouth daily.   SUMAtriptan (IMITREX) 100 MG tablet 1 TAB EVERY 2 HOURS AS NEEDED FOR MIGRAINE MAY REPEAT IN 2 HOURS IF HEADACHE PERSISTS OR RECURS   No facility-administered encounter medications on file as of 08/31/2021.    Past Surgical History:  Procedure Laterality Date   WISDOM TOOTH EXTRACTION      Family History  Problem Relation Age of Onset   Gout Mother    Cancer Mother        melanoma   Hypertension Mother    Diabetes Father    Hypertension Father    Arthritis Father    Heart attack Maternal Grandmother    Heart attack  Maternal Grandfather    Diabetes Paternal Grandmother    Arthritis Paternal Grandmother     New complaints: Chronic low back pain. Has been going on for over a month. Pnly occus when she has her menses.  Social history: Lives with husband  Controlled substance contract: n/a     Review of Systems  Constitutional:  Negative for diaphoresis.  Eyes:  Negative for pain.  Respiratory:  Negative for shortness of breath.   Cardiovascular:  Negative for chest pain, palpitations and leg swelling.  Gastrointestinal:  Negative for abdominal pain.  Endocrine: Negative for polydipsia.  Skin:  Negative for rash.  Neurological:  Negative for dizziness, weakness and headaches.  Hematological:  Does not bruise/bleed easily.  All other systems reviewed and are negative.     Objective:   Physical Exam Vitals and nursing note reviewed.  Constitutional:      General: She is not in acute distress.    Appearance: Normal appearance. She is well-developed.  HENT:     Head: Normocephalic.     Right Ear: Tympanic membrane normal.     Left Ear: Tympanic membrane normal.     Nose: Nose normal.     Mouth/Throat:     Mouth: Mucous membranes are moist.  Eyes:  Pupils: Pupils are equal, round, and reactive to light.  Neck:     Vascular: No carotid bruit or JVD.  Cardiovascular:     Rate and Rhythm: Normal rate and regular rhythm.     Heart sounds: Normal heart sounds.  Pulmonary:     Effort: Pulmonary effort is normal. No respiratory distress.     Breath sounds: Normal breath sounds. No wheezing or rales.  Chest:     Chest wall: No tenderness.  Abdominal:     General: Bowel sounds are normal. There is no distension or abdominal bruit.     Palpations: Abdomen is soft. There is no hepatomegaly, splenomegaly, mass or pulsatile mass.     Tenderness: There is no abdominal tenderness.  Musculoskeletal:        General: Normal range of motion.     Cervical back: Normal range of motion and neck  supple.  Lymphadenopathy:     Cervical: No cervical adenopathy.  Skin:    General: Skin is warm and dry.  Neurological:     Mental Status: She is alert and oriented to person, place, and time.     Deep Tendon Reflexes: Reflexes are normal and symmetric.  Psychiatric:        Behavior: Behavior normal.        Thought Content: Thought content normal.        Judgment: Judgment normal.    BP 115/79   Pulse 90   Temp (!) 96.6 F (35.9 C) (Temporal)   Resp 20   Ht 5' 8"  (1.727 m)   Wt 276 lb (125.2 kg)   SpO2 95%   BMI 41.97 kg/m        Assessment & Plan:  Michaela Campbell comes in today with chief complaint of Annual Exam (No pap/)   Diagnosis and orders addressed:  1. Annual physical exam - norethindrone-ethinyl estradiol (NORTREL 0.5/35, 28,) 0.5-35 MG-MCG tablet; Take 1 tablet by mouth daily.  Dispense: 84 tablet; Refill: 3 - CBC with Differential/Platelet - CMP14+EGFR - Lipid panel - Thyroid Panel With TSH  2. Other migraine without status migrainosus, not intractable Avid caffiene - SUMAtriptan (IMITREX) 100 MG tablet; 1 TAB EVERY 2 HOURS AS NEEDED FOR MIGRAINE MAY REPEAT IN 2 HOURS IF HEADACHE PERSISTS OR RECURS  Dispense: 10 tablet; Refill: 11  3. Piriformis syndrome of left side Patient denies this diagnosis  4. GAD (generalized anxiety disorder) Stress management - sertraline (ZOLOFT) 50 MG tablet; Take 1 tablet (50 mg total) by mouth daily.  Dispense: 90 tablet; Refill: 1   Labs pending Health Maintenance reviewed Diet and exercise encouraged  Follow up plan: prn   [name] Done, FNP

## 2021-08-31 NOTE — Addendum Note (Signed)
Addended by: Bennie Pierini on: 08/31/2021 09:56 AM   Modules accepted: Orders

## 2021-09-05 LAB — CMP14+EGFR
ALT: 13 IU/L (ref 0–32)
AST: 18 IU/L (ref 0–40)
Albumin/Globulin Ratio: 1.5 (ref 1.2–2.2)
Albumin: 3.9 g/dL (ref 3.8–4.8)
Alkaline Phosphatase: 54 IU/L (ref 44–121)
BUN/Creatinine Ratio: 9 (ref 9–23)
BUN: 8 mg/dL (ref 6–20)
Bilirubin Total: 0.3 mg/dL (ref 0.0–1.2)
CO2: 24 mmol/L (ref 20–29)
Calcium: 9 mg/dL (ref 8.7–10.2)
Chloride: 101 mmol/L (ref 96–106)
Creatinine, Ser: 0.85 mg/dL (ref 0.57–1.00)
Globulin, Total: 2.6 g/dL (ref 1.5–4.5)
Glucose: 88 mg/dL (ref 70–99)
Potassium: 4.2 mmol/L (ref 3.5–5.2)
Sodium: 140 mmol/L (ref 134–144)
Total Protein: 6.5 g/dL (ref 6.0–8.5)
eGFR: 90 mL/min/{1.73_m2} (ref >59)

## 2021-09-05 LAB — CBC WITH DIFFERENTIAL/PLATELET
Basophils Absolute: 0 10*3/uL (ref 0.0–0.2)
Basos: 0 %
EOS (ABSOLUTE): 0.1 10*3/uL (ref 0.0–0.4)
Eos: 2 %
Hematocrit: 36.1 % (ref 34.0–46.6)
Hemoglobin: 12.3 g/dL (ref 11.1–15.9)
Immature Grans (Abs): 0 10*3/uL (ref 0.0–0.1)
Immature Granulocytes: 0 %
Lymphocytes Absolute: 2.3 10*3/uL (ref 0.7–3.1)
Lymphs: 28 %
MCH: 29.4 pg (ref 26.6–33.0)
MCHC: 34.1 g/dL (ref 31.5–35.7)
MCV: 86 fL (ref 79–97)
Monocytes Absolute: 0.5 10*3/uL (ref 0.1–0.9)
Monocytes: 6 %
Neutrophils Absolute: 5.1 10*3/uL (ref 1.4–7.0)
Neutrophils: 64 %
Platelets: 242 10*3/uL (ref 150–450)
RBC: 4.18 x10E6/uL (ref 3.77–5.28)
RDW: 12.5 % (ref 11.7–15.4)
WBC: 8 10*3/uL (ref 3.4–10.8)

## 2021-09-05 LAB — LIPID PANEL
Chol/HDL Ratio: 3.3 ratio (ref 0.0–4.4)
Cholesterol, Total: 197 mg/dL (ref 100–199)
HDL: 59 mg/dL (ref >39)
LDL Chol Calc (NIH): 115 mg/dL — ABNORMAL HIGH (ref 0–99)
Triglycerides: 133 mg/dL (ref 0–149)
VLDL Cholesterol Cal: 23 mg/dL (ref 5–40)

## 2021-09-05 LAB — THYROID PANEL WITH TSH
Free Thyroxine Index: 1.9 (ref 1.2–4.9)
T3 Uptake Ratio: 20 % — ABNORMAL LOW (ref 24–39)
T4, Total: 9.4 ug/dL (ref 4.5–12.0)
TSH: 1.65 u[IU]/mL (ref 0.450–4.500)

## 2021-09-15 ENCOUNTER — Other Ambulatory Visit: Payer: Self-pay | Admitting: Nurse Practitioner

## 2022-02-06 ENCOUNTER — Other Ambulatory Visit: Payer: Self-pay | Admitting: Nurse Practitioner

## 2022-02-22 ENCOUNTER — Other Ambulatory Visit: Payer: Self-pay | Admitting: Nurse Practitioner

## 2022-05-08 ENCOUNTER — Other Ambulatory Visit: Payer: Self-pay | Admitting: Nurse Practitioner

## 2022-05-08 DIAGNOSIS — F411 Generalized anxiety disorder: Secondary | ICD-10-CM

## 2022-06-30 ENCOUNTER — Other Ambulatory Visit: Payer: Self-pay | Admitting: Nurse Practitioner

## 2022-08-04 ENCOUNTER — Other Ambulatory Visit: Payer: Self-pay | Admitting: Nurse Practitioner

## 2022-09-01 ENCOUNTER — Ambulatory Visit (INDEPENDENT_AMBULATORY_CARE_PROVIDER_SITE_OTHER): Payer: PRIVATE HEALTH INSURANCE | Admitting: Nurse Practitioner

## 2022-09-01 ENCOUNTER — Encounter: Payer: Self-pay | Admitting: Nurse Practitioner

## 2022-09-01 ENCOUNTER — Other Ambulatory Visit (HOSPITAL_COMMUNITY)
Admission: RE | Admit: 2022-09-01 | Discharge: 2022-09-01 | Disposition: A | Discharge status: Home / Self Care | Payer: PRIVATE HEALTH INSURANCE | Source: Ambulatory Visit | Attending: Nurse Practitioner | Admitting: Nurse Practitioner

## 2022-09-01 VITALS — BP 110/73 | HR 73 | Temp 96.7°F | Resp 20 | Ht 68.0 in | Wt 277.0 lb

## 2022-09-01 DIAGNOSIS — G43809 Other migraine, not intractable, without status migrainosus: Secondary | ICD-10-CM | POA: Diagnosis not present

## 2022-09-01 DIAGNOSIS — Z0000 Encounter for general adult medical examination without abnormal findings: Secondary | ICD-10-CM | POA: Insufficient documentation

## 2022-09-01 DIAGNOSIS — F411 Generalized anxiety disorder: Secondary | ICD-10-CM | POA: Diagnosis not present

## 2022-09-01 DIAGNOSIS — Z0001 Encounter for general adult medical examination with abnormal findings: Secondary | ICD-10-CM

## 2022-09-01 LAB — URINALYSIS, COMPLETE
Bilirubin, UA: NEGATIVE
Glucose, UA: NEGATIVE
Ketones, UA: NEGATIVE
Leukocytes,UA: NEGATIVE
Nitrite, UA: NEGATIVE
Protein,UA: NEGATIVE
Specific Gravity, UA: >1.03 — ABNORMAL HIGH (ref 1.005–1.030)
Urobilinogen, Ur: 0.2 mg/dL (ref 0.2–1.0)
pH, UA: 5.5 (ref 5.0–7.5)

## 2022-09-01 LAB — MICROSCOPIC EXAMINATION
Renal Epithel, UA: NONE SEEN /hpf
WBC, UA: NONE SEEN /hpf (ref 0–5)

## 2022-09-01 MED ORDER — NORTREL 0.5/35 (28) 0.5-35 MG-MCG PO TABS: 1.0000 | ORAL_TABLET | Freq: Every day | ORAL | 3 refills | Status: DC

## 2022-09-01 MED ORDER — PHENTERMINE HCL 37.5 MG PO TABS: 37.5000 mg | ORAL_TABLET | Freq: Every day | ORAL | 4 refills | Status: DC

## 2022-09-01 MED ORDER — SERTRALINE HCL 50 MG PO TABS: 50.0000 mg | ORAL_TABLET | Freq: Every day | ORAL | 1 refills | Status: DC

## 2022-09-01 NOTE — Progress Notes (Signed)
Subjective:    Patient ID: Michaela Campbell, female    DOB: June 13, 1986, 36 y.o.   MRN: [id number]   Chief Complaint: medical management of chronic issues     HPI:  Michaela Campbell is a 36 y.o. who identifies as a female who was assigned female at birth.   Social history: Lives with: husband and kids Work history: Engineer, structural   Comes in today for follow up of the following chronic medical issues:  1. Annual physical exam Also pap today  2. Other migraine without status migrainosus, not intractable Has occasionally. Imitrex works well to resolve.  3. GAD (generalized anxiety disorder) Is on zoloft daily and is doing well.    09/01/2022    9:32 AM 08/31/2021    9:31 AM 08/27/2020    9:34 AM  GAD 7 : Generalized Anxiety Score  Nervous, Anxious, on Edge 0 0 1  Control/stop worrying 0 0 0  Worry too much - different things 0 0 0  Trouble relaxing 0 0 0  Restless 0 0 0  Easily annoyed or irritable 0 0 0  Afraid - awful might happen 0 0 0  Total GAD 7 Score 0 0 1  Anxiety Difficulty Not difficult at all Not difficult at all Not difficult at all       New complaints: None today  No Known Allergies Outpatient Encounter Medications as of 09/01/2022  Medication Sig   clobetasol cream (TEMOVATE) 9.62 % Apply 1 application topically 2 (two) times daily.   diclofenac (VOLTAREN) 75 MG EC tablet TAKE 1 TABLET BY MOUTH TWICE A DAY   norethindrone-ethinyl estradiol (NORTREL 0.5/35, 28,) 0.5-35 MG-MCG tablet Take 1 tablet by mouth daily.   phentermine (ADIPEX-P) 37.5 MG tablet Take 1 tablet (37.5 mg total) by mouth daily before breakfast.   sertraline (ZOLOFT) 50 MG tablet TAKE 1 TABLET BY MOUTH EVERY DAY   SUMAtriptan (IMITREX) 100 MG tablet 1 TAB EVERY 2 HOURS AS NEEDED FOR MIGRAINE MAY REPEAT IN 2 HOURS IF HEADACHE PERSISTS OR RECURS   No facility-administered encounter medications on file as of 09/01/2022.    Past Surgical History:  Procedure Laterality Date    WISDOM TOOTH EXTRACTION      Family History  Problem Relation Age of Onset   Gout Mother    Cancer Mother        melanoma   Hypertension Mother    Diabetes Father    Hypertension Father    Arthritis Father    Heart attack Maternal Grandmother    Heart attack Maternal Grandfather    Diabetes Paternal Grandmother    Arthritis Paternal Grandmother       Controlled substance contract: n/a     Review of Systems  Constitutional:  Negative for diaphoresis.  Eyes:  Negative for pain.  Respiratory:  Negative for shortness of breath.   Cardiovascular:  Negative for chest pain, palpitations and leg swelling.  Gastrointestinal:  Negative for abdominal pain.  Endocrine: Negative for polydipsia.  Skin:  Negative for rash.  Neurological:  Negative for dizziness, weakness and headaches.  Hematological:  Does not bruise/bleed easily.  All other systems reviewed and are negative.      Objective:   Physical Exam Vitals and nursing note reviewed.  Constitutional:      General: She is not in acute distress.    Appearance: Normal appearance. She is well-developed.  HENT:     Head: Normocephalic.     Right Ear: Tympanic membrane normal.  Left Ear: Tympanic membrane normal.     Nose: Nose normal.     Mouth/Throat:     Mouth: Mucous membranes are moist.  Eyes:     Pupils: Pupils are equal, round, and reactive to light.  Neck:     Vascular: No carotid bruit or JVD.  Cardiovascular:     Rate and Rhythm: Normal rate and regular rhythm.     Heart sounds: Normal heart sounds.  Pulmonary:     Effort: Pulmonary effort is normal. No respiratory distress.     Breath sounds: Normal breath sounds. No wheezing or rales.  Chest:     Chest wall: No tenderness.  Abdominal:     General: Bowel sounds are normal. There is no distension or abdominal bruit.     Palpations: Abdomen is soft. There is no hepatomegaly, splenomegaly, mass or pulsatile mass.     Tenderness: There is no abdominal  tenderness.  Genitourinary:    General: Normal vulva.     Vagina: No vaginal discharge.     Rectum: Normal.     Comments: Cervix parous and pink No adnexal masses or tenderness Musculoskeletal:        General: Normal range of motion.     Cervical back: Normal range of motion and neck supple.  Lymphadenopathy:     Cervical: No cervical adenopathy.  Skin:    General: Skin is warm and dry.  Neurological:     Mental Status: She is alert and oriented to person, place, and time.     Deep Tendon Reflexes: Reflexes are normal and symmetric.  Psychiatric:        Behavior: Behavior normal.        Thought Content: Thought content normal.        Judgment: Judgment normal.    BP 110/73   Pulse 73   Temp (!) 96.7 F (35.9 C) (Temporal)   Resp 20   Ht 5' 8" (1.727 m)   Wt 277 lb (125.6 kg)   SpO2 96%   BMI 42.12 kg/m         Assessment & Plan:  Michaela Campbell comes in today with chief complaint of No chief complaint on file.   Diagnosis and orders addressed:  1. Annual physical exam - CBC with Differential/Platelet - CMP14+EGFR - Lipid panel - Cytology - PAP - Urinalysis, Complete - norethindrone-ethinyl estradiol (NORTREL 0.5/35, 28,) 0.5-35 MG-MCG tablet; Take 1 tablet by mouth daily.  Dispense: 84 tablet; Refill: 3  2. Other migraine without status migrainosus, not intractable Avoid caffeine Imitrex as needed  3. GAD (generalized anxiety disorder) Stress management - sertraline (ZOLOFT) 50 MG tablet; Take 1 tablet (50 mg total) by mouth daily.  Dispense: 90 tablet; Refill: 1  4. Morbid obesity ([place]) Discussed diet and exercise for person with BMI >25 Will recheck weight in 3-6 months  - phentermine (ADIPEX-P) 37.5 MG tablet; Take 1 tablet (37.5 mg total) by mouth daily before breakfast.  Dispense: 30 tablet; Refill: 4   Labs pending Health Maintenance reviewed Diet and exercise encouraged  Follow up plan: 6 months   [name] Done, FNP

## 2022-09-01 NOTE — Patient Instructions (Signed)
Exercising to Stay Healthy To become healthy and stay healthy, it is recommended that you do moderate-intensity and vigorous-intensity exercise. You can tell that you are exercising at a moderate intensity if your heart starts beating faster and you start breathing faster but can still hold a conversation. You can tell that you are exercising at a vigorous intensity if you are breathing much harder and faster and cannot hold a conversation while exercising. How can exercise benefit me? Exercising regularly is important. It has many health benefits, such as: Improving overall fitness, flexibility, and endurance. Increasing bone density. Helping with weight control. Decreasing body fat. Increasing muscle strength and endurance. Reducing stress and tension, anxiety, depression, or anger. Improving overall health. What guidelines should I follow while exercising? Before you start a new exercise program, talk with your health care provider. Do not exercise so much that you hurt yourself, feel dizzy, or get very short of breath. Wear comfortable clothes and wear shoes with good support. Drink plenty of water while you exercise to prevent dehydration or heat stroke. Work out until your breathing and your heartbeat get faster (moderate intensity). How often should I exercise? Choose an activity that you enjoy, and set realistic goals. Your health care provider can help you make an activity plan that is individually designed and works best for you. Exercise regularly as told by your health care provider. This may include: Doing strength training two times a week, such as: Lifting weights. Using resistance bands. Push-ups. Sit-ups. Yoga. Doing a certain intensity of exercise for a given amount of time. Choose from these options: A total of 150 minutes of moderate-intensity exercise every week. A total of 75 minutes of vigorous-intensity exercise every week. A mix of moderate-intensity and  vigorous-intensity exercise every week. Children, pregnant women, people who have not exercised regularly, people who are overweight, and older adults may need to talk with a health care provider about what activities are safe to perform. If you have a medical condition, be sure to talk with your health care provider before you start a new exercise program. What are some exercise ideas? Moderate-intensity exercise ideas include: Walking 1 mile (1.6 km) in about 15 minutes. Biking. Hiking. Golfing. Dancing. Water aerobics. Vigorous-intensity exercise ideas include: Walking 4.5 miles (7.2 km) or more in about 1 hour. Jogging or running 5 miles (8 km) in about 1 hour. Biking 10 miles (16.1 km) or more in about 1 hour. Lap swimming. Roller-skating or in-line skating. Cross-country skiing. Vigorous competitive sports, such as football, basketball, and soccer. Jumping rope. Aerobic dancing. What are some everyday activities that can help me get exercise? Yard work, such as: Pushing a lawn mower. Raking and bagging leaves. Washing your car. Pushing a stroller. Shoveling snow. Gardening. Washing windows or floors. How can I be more active in my day-to-day activities? Use stairs instead of an elevator. Take a walk during your lunch break. If you drive, park your car farther away from your work or school. If you take public transportation, get off one stop early and walk the rest of the way. Stand up or walk around during all of your indoor phone calls. Get up, stretch, and walk around every 30 minutes throughout the day. Enjoy exercise with a friend. Support to continue exercising will help you keep a regular routine of activity. Where to find more information You can find more information about exercising to stay healthy from: U.S. Department of Health and Human Services: www.hhs.gov Centers for Disease Control and Prevention (  CDC): www.cdc.gov Summary Exercising regularly is  important. It will improve your overall fitness, flexibility, and endurance. Regular exercise will also improve your overall health. It can help you control your weight, reduce stress, and improve your bone density. Do not exercise so much that you hurt yourself, feel dizzy, or get very short of breath. Before you start a new exercise program, talk with your health care provider. This information is not intended to replace advice given to you by your health care provider. Make sure you discuss any questions you have with your health care provider. Document Revised: 02/12/2021 Document Reviewed: 02/12/2021 Elsevier Patient Education  2023 Elsevier Inc.  

## 2022-09-01 NOTE — Addendum Note (Signed)
Addended by: Chevis Pretty on: 09/01/2022 10:03 AM   Modules accepted: Orders

## 2022-09-02 LAB — CMP14+EGFR
ALT: 13 IU/L (ref 0–32)
AST: 15 IU/L (ref 0–40)
Albumin/Globulin Ratio: 1.3 (ref 1.2–2.2)
Albumin: 3.8 g/dL — ABNORMAL LOW (ref 3.9–4.9)
Alkaline Phosphatase: 65 IU/L (ref 44–121)
BUN/Creatinine Ratio: 13 (ref 9–23)
BUN: 10 mg/dL (ref 6–20)
Bilirubin Total: <0.2 mg/dL (ref 0.0–1.2)
CO2: 22 mmol/L (ref 20–29)
Calcium: 8.8 mg/dL (ref 8.7–10.2)
Chloride: 103 mmol/L (ref 96–106)
Creatinine, Ser: 0.78 mg/dL (ref 0.57–1.00)
Globulin, Total: 2.9 g/dL (ref 1.5–4.5)
Glucose: 88 mg/dL (ref 70–99)
Potassium: 4.1 mmol/L (ref 3.5–5.2)
Sodium: 137 mmol/L (ref 134–144)
Total Protein: 6.7 g/dL (ref 6.0–8.5)
eGFR: 100 mL/min/{1.73_m2} (ref >59)

## 2022-09-02 LAB — LIPID PANEL
Chol/HDL Ratio: 3.4 ratio (ref 0.0–4.4)
Cholesterol, Total: 178 mg/dL (ref 100–199)
HDL: 52 mg/dL (ref >39)
LDL Chol Calc (NIH): 98 mg/dL (ref 0–99)
Triglycerides: 159 mg/dL — ABNORMAL HIGH (ref 0–149)
VLDL Cholesterol Cal: 28 mg/dL (ref 5–40)

## 2022-09-02 LAB — CBC WITH DIFFERENTIAL/PLATELET
Basophils Absolute: 0 10*3/uL (ref 0.0–0.2)
Basos: 0 %
EOS (ABSOLUTE): 0.1 10*3/uL (ref 0.0–0.4)
Eos: 1 %
Hematocrit: 37.1 % (ref 34.0–46.6)
Hemoglobin: 12.3 g/dL (ref 11.1–15.9)
Immature Grans (Abs): 0 10*3/uL (ref 0.0–0.1)
Immature Granulocytes: 0 %
Lymphocytes Absolute: 2.8 10*3/uL (ref 0.7–3.1)
Lymphs: 28 %
MCH: 28.9 pg (ref 26.6–33.0)
MCHC: 33.2 g/dL (ref 31.5–35.7)
MCV: 87 fL (ref 79–97)
Monocytes Absolute: 0.6 10*3/uL (ref 0.1–0.9)
Monocytes: 6 %
Neutrophils Absolute: 6.4 10*3/uL (ref 1.4–7.0)
Neutrophils: 65 %
Platelets: 248 10*3/uL (ref 150–450)
RBC: 4.26 x10E6/uL (ref 3.77–5.28)
RDW: 12.5 % (ref 11.7–15.4)
WBC: 10 10*3/uL (ref 3.4–10.8)

## 2022-09-02 LAB — THYROID PANEL WITH TSH
Free Thyroxine Index: 1.7 (ref 1.2–4.9)
T3 Uptake Ratio: 18 % — ABNORMAL LOW (ref 24–39)
T4, Total: 9.3 ug/dL (ref 4.5–12.0)
TSH: 3.27 u[IU]/mL (ref 0.450–4.500)

## 2022-09-02 LAB — VITAMIN B12: Vitamin B-12: 326 pg/mL (ref 232–1245)

## 2022-09-02 LAB — VITAMIN D 25 HYDROXY (VIT D DEFICIENCY, FRACTURES): Vit D, 25-Hydroxy: 23.1 ng/mL — ABNORMAL LOW (ref 30.0–100.0)

## 2022-09-02 MED ORDER — VITAMIN D (ERGOCALCIFEROL) 1.25 MG (50000 UNIT) PO CAPS: 50000.0000 [IU] | ORAL_CAPSULE | ORAL | 3 refills | Status: DC

## 2022-09-02 NOTE — Addendum Note (Signed)
Addended by: Chevis Pretty on: 09/02/2022 11:14 AM   Modules accepted: Orders

## 2022-09-05 LAB — CYTOLOGY - PAP
Chlamydia: NEGATIVE
Comment: NEGATIVE
Comment: NEGATIVE
Comment: NORMAL
Diagnosis: NEGATIVE
Neisseria Gonorrhea: NEGATIVE
Trichomonas: NEGATIVE

## 2022-09-29 ENCOUNTER — Other Ambulatory Visit: Payer: Self-pay | Admitting: Nurse Practitioner

## 2022-09-29 DIAGNOSIS — G43809 Other migraine, not intractable, without status migrainosus: Secondary | ICD-10-CM

## 2022-10-21 ENCOUNTER — Other Ambulatory Visit: Payer: Self-pay | Admitting: Nurse Practitioner

## 2022-12-28 ENCOUNTER — Other Ambulatory Visit: Payer: Self-pay | Admitting: Nurse Practitioner

## 2023-01-20 ENCOUNTER — Telehealth: Payer: PRIVATE HEALTH INSURANCE | Admitting: Nurse Practitioner

## 2023-01-20 DIAGNOSIS — J40 Bronchitis, not specified as acute or chronic: Secondary | ICD-10-CM | POA: Diagnosis not present

## 2023-01-20 MED ORDER — PREDNISONE 20 MG PO TABS: 40.0000 mg | ORAL_TABLET | Freq: Every day | ORAL | 0 refills | Status: AC

## 2023-01-20 MED ORDER — HYDROCODONE BIT-HOMATROP MBR 5-1.5 MG/5ML PO SOLN: 5.0000 mL | Freq: Three times a day (TID) | ORAL | 0 refills | Status: DC | PRN

## 2023-01-20 NOTE — Progress Notes (Signed)
Virtual Visit Consent   Michaela Campbell, you are scheduled for a virtual visit with [name] Done, [place], a [facility] provider, today.     Just as with appointments in the office, your consent must be obtained to participate.  Your consent will be active for this visit and any virtual visit you may have with one of our providers in the next 365 days.     If you have a MyChart account, a copy of this consent can be sent to you electronically.  All virtual visits are billed to your insurance company just like a traditional visit in the office.    As this is a virtual visit, video technology does not allow for your provider to perform a traditional examination.  This may limit your provider's ability to fully assess your condition.  If your provider identifies any concerns that need to be evaluated in person or the need to arrange testing (such as labs, EKG, etc.), we will make arrangements to do so.     Although advances in technology are sophisticated, we cannot ensure that it will always work on either your end or our end.  If the connection with a video visit is poor, the visit may have to be switched to a telephone visit.  With either a video or telephone visit, we are not always able to ensure that we have a secure connection.     I need to obtain your verbal consent now.   Are you willing to proceed with your visit today? YES   Michaela Campbell has provided verbal consent on 01/20/2023 for a virtual visit (video or telephone).   [name] Done, FNP   Date: 01/20/2023 10:57 AM   Virtual Visit via Video Note   I, [name] Done, connected with Michaela Campbell, 12/16/1985) on 01/20/23 at  2:05 PM EDT by a video-enabled telemedicine application and verified that I am speaking with the correct person using two identifiers.  Location: Patient: Virtual Visit Location Patient: Mobile Provider: Virtual Visit Location Provider: Mobile   I discussed the  limitations of evaluation and management by telemedicine and the availability of in person appointments. The patient expressed understanding and agreed to proceed.    History of Present Illness: Michaela Campbell is a 37 y.o. who identifies as a female who was assigned female at birth, and is being seen today for cough.  HPI: Cough This is a new problem. The current episode started 1 to 4 weeks ago. The cough is Productive of sputum. Associated symptoms include headaches and rhinorrhea. Pertinent negatives include no ear congestion, fever, sore throat or shortness of breath. Nothing aggravates the symptoms. She has tried OTC cough suppressant for the symptoms. The treatment provided mild relief. Her past medical history is significant for bronchitis.    Review of Systems  Constitutional:  Negative for fever.  HENT:  Positive for rhinorrhea. Negative for sore throat.   Respiratory:  Positive for cough. Negative for shortness of breath.   Neurological:  Positive for headaches.    Problems:  Patient Active Problem List   Diagnosis Date Noted   GAD (generalized anxiety disorder) 08/27/2020   Migraine 12/19/2016   Birth control 12/19/2016    Allergies: No Known Allergies Medications:  Current Outpatient Medications:    diclofenac (VOLTAREN) 75 MG EC tablet, Take 1 tablet (75 mg total) by mouth 2 (two) times daily., Disp: 60 tablet, Rfl: 0   norethindrone-ethinyl estradiol (NORTREL 0.5/35, 28,) 0.5-35 MG-MCG tablet, Take  1 tablet by mouth daily., Disp: 84 tablet, Rfl: 3   phentermine (ADIPEX-P) 37.5 MG tablet, Take 1 tablet (37.5 mg total) by mouth daily before breakfast., Disp: 30 tablet, Rfl: 4   sertraline (ZOLOFT) 50 MG tablet, Take 1 tablet (50 mg total) by mouth daily., Disp: 90 tablet, Rfl: 1   SUMAtriptan (IMITREX) 100 MG tablet, TAKE 1 TAB EVERY 2 HOURS AS NEEDED FOR MIGRAINE MAY REPEAT IN 2 HOURS IF HEADACHE PERSISTS OR RECURS, Disp: 10 tablet, Rfl: 2   Vitamin D, Ergocalciferol,  (DRISDOL) 1.25 MG (50000 UNIT) CAPS capsule, Take 1 capsule (50,000 Units total) by mouth every 7 (seven) days., Disp: 12 capsule, Rfl: 3  Observations/Objective: Patient is well-developed, well-nourished in no acute distress.  Resting comfortably  at home.  Head is normocephalic, atraumatic.  No labored breathing.  Speech is clear and coherent with logical content.  Patient is alert and oriented at baseline.  Deep cough  Assessment and Plan:  Michaela Campbell in today with chief complaint of Cough   1. Bronchitis 1. Take meds as prescribed 2. Use a cool mist humidifier especially during the winter months and when heat has been humid. 3. Use saline nose sprays frequently 4. Saline irrigations of the nose can be very helpful if done frequently.  * 4X daily for 1 week*  * Use of a nettie pot can be helpful with this. Follow directions with this* 5. Drink plenty of fluids 6. Keep thermostat turn down low 7.For any cough or congestion- delsym or mucinex during th eday 8. For fever or aces or pains- take tylenol or ibuprofen appropriate for age and weight.  * for fevers greater than 101 orally you may alternate ibuprofen and tylenol every  3 hours.    - predniSONE (DELTASONE) 20 MG tablet; Take 2 tablets (40 mg total) by mouth daily with breakfast for 5 days. 2 po daily for 5 days  Dispense: 10 tablet; Refill: 0 - HYDROcodone bit-homatropine (HYCODAN) 5-1.5 MG/5ML syrup; Take 5 mLs by mouth every 8 (eight) hours as needed for cough.  Dispense: 120 mL; Refill: 0    The above assessment and management plan was discussed with the patient. The patient verbalized understanding of and has agreed to the management plan. Patient is aware to call the clinic if symptoms persist or worsen. Patient is aware when to return to the clinic for a follow-up visit. Patient educated on when it is appropriate to go to the emergency department.   [name] Done, FNP    Follow Up Instructions: I  discussed the assessment and treatment plan with the patient. The patient was provided an opportunity to ask questions and all were answered. The patient agreed with the plan and demonstrated an understanding of the instructions.  A copy of instructions were sent to the patient via MyChart.  The patient was advised to call back or seek an in-person evaluation if the symptoms worsen or if the condition fails to improve as anticipated.  Time:  I spent 7 minutes with the patient via telehealth technology discussing the above problems/concerns.    [name] Done, FNP

## 2023-01-20 NOTE — Patient Instructions (Signed)
Earney Hamburg, thank you for joining Chevis Pretty, FNP for today's virtual visit.  While this provider is not your primary care provider (PCP), if your PCP is located in our provider database this encounter information will be shared with them immediately following your visit.   [facility] account gives you access to today's visit and all your visits, tests, and labs performed at [facility] " click here if you don't have a [facility] account or go to [email or web address]  Consent: (Patient) Michaela Campbell provided verbal consent for this virtual visit at the beginning of the encounter.  Current Medications:  Current Outpatient Medications:    HYDROcodone bit-homatropine (HYCODAN) 5-1.5 MG/5ML syrup, Take 5 mLs by mouth every 8 (eight) hours as needed for cough., Disp: 120 mL, Rfl: 0   predniSONE (DELTASONE) 20 MG tablet, Take 2 tablets (40 mg total) by mouth daily with breakfast for 5 days. 2 po daily for 5 days, Disp: 10 tablet, Rfl: 0   diclofenac (VOLTAREN) 75 MG EC tablet, Take 1 tablet (75 mg total) by mouth 2 (two) times daily., Disp: 60 tablet, Rfl: 0   norethindrone-ethinyl estradiol (NORTREL 0.5/35, 28,) 0.5-35 MG-MCG tablet, Take 1 tablet by mouth daily., Disp: 84 tablet, Rfl: 3   phentermine (ADIPEX-P) 37.5 MG tablet, Take 1 tablet (37.5 mg total) by mouth daily before breakfast., Disp: 30 tablet, Rfl: 4   sertraline (ZOLOFT) 50 MG tablet, Take 1 tablet (50 mg total) by mouth daily., Disp: 90 tablet, Rfl: 1   SUMAtriptan (IMITREX) 100 MG tablet, TAKE 1 TAB EVERY 2 HOURS AS NEEDED FOR MIGRAINE MAY REPEAT IN 2 HOURS IF HEADACHE PERSISTS OR RECURS, Disp: 10 tablet, Rfl: 2   Vitamin D, Ergocalciferol, (DRISDOL) 1.25 MG (50000 UNIT) CAPS capsule, Take 1 capsule (50,000 Units total) by mouth every 7 (seven) days., Disp: 12 capsule, Rfl: 3   Medications ordered in this encounter:  Meds ordered this encounter  Medications   predniSONE  (DELTASONE) 20 MG tablet    Sig: Take 2 tablets (40 mg total) by mouth daily with breakfast for 5 days. 2 po daily for 5 days    Dispense:  10 tablet    Refill:  0    Order Specific Question:   Supervising Provider    Answer:   Caryl Pina A [id number]   HYDROcodone bit-homatropine (HYCODAN) 5-1.5 MG/5ML syrup    Sig: Take 5 mLs by mouth every 8 (eight) hours as needed for cough.    Dispense:  120 mL    Refill:  0    Order Specific Question:   Supervising Provider    Answer:   Caryl Pina A [id number]     *If you need refills on other medications prior to your next appointment, please contact your pharmacy*  Follow-Up: Call back or seek an in-person evaluation if the symptoms worsen or if the condition fails to improve as anticipated.  [place]  Other Instructions 1. Take meds as prescribed 2. Use a cool mist humidifier especially during the winter months and when heat has been humid. 3. Use saline nose sprays frequently 4. Saline irrigations of the nose can be very helpful if done frequently.  * 4X daily for 1 week*  * Use of a nettie pot can be helpful with this. Follow directions with this* 5. Drink plenty of fluids 6. Keep thermostat turn down low 7.For any cough or congestion- delsym or mucinex during the day 8. For fever or  aces or pains- take tylenol or ibuprofen appropriate for age and weight.  * for fevers greater than 101 orally you may alternate ibuprofen and tylenol every  3 hours.      If you have been instructed to have an in-person evaluation today at a local Urgent Care facility, please use the link below. It will take you to a list of all of our available [facility], including address, phone number and hours of operation. Please do not delay care.  [facility]  If you or a family member do not have a primary care provider, use the link below to schedule a visit and establish care. When you  choose a [facility] primary care physician or advanced practice provider, you gain a long-term partner in health. Find a Primary Care Provider  Learn more about [facility]'s in-office and virtual care options: [facility]

## 2023-01-26 ENCOUNTER — Other Ambulatory Visit: Payer: Self-pay | Admitting: Nurse Practitioner

## 2023-01-26 MED ORDER — AMOXICILLIN-POT CLAVULANATE 875-125 MG PO TABS: 1.0000 | ORAL_TABLET | Freq: Two times a day (BID) | ORAL | 0 refills | Status: DC

## 2023-01-31 ENCOUNTER — Other Ambulatory Visit: Payer: Self-pay | Admitting: Nurse Practitioner

## 2023-02-02 MED ORDER — FLUCONAZOLE 150 MG PO TABS: 150.0000 mg | ORAL_TABLET | Freq: Once | ORAL | 0 refills | Status: AC

## 2023-02-02 MED ORDER — MONISTAT 3 4 % VA CREA: TOPICAL_CREAM | VAGINAL | 0 refills | Status: DC

## 2023-02-02 NOTE — Telephone Encounter (Signed)
I will send in diflucan and monistat- probably yeast

## 2023-03-04 ENCOUNTER — Other Ambulatory Visit: Payer: Self-pay | Admitting: Nurse Practitioner

## 2023-03-04 NOTE — Telephone Encounter (Signed)
Last office visit 09/01/22 Last refill 09/01/22 #30 4 refills

## 2023-03-17 ENCOUNTER — Telehealth: Payer: Self-pay | Admitting: Nurse Practitioner

## 2023-03-17 NOTE — Telephone Encounter (Signed)
R/c appt made for pt. Pt aware

## 2023-03-20 ENCOUNTER — Ambulatory Visit: Payer: PRIVATE HEALTH INSURANCE | Admitting: Nurse Practitioner

## 2023-03-20 ENCOUNTER — Encounter: Payer: Self-pay | Admitting: Nurse Practitioner

## 2023-03-20 ENCOUNTER — Other Ambulatory Visit: Payer: Self-pay | Admitting: Nurse Practitioner

## 2023-03-20 VITALS — BP 131/80 | HR 79 | Temp 96.7°F | Resp 20 | Ht 68.0 in | Wt 276.0 lb

## 2023-03-20 DIAGNOSIS — N62 Hypertrophy of breast: Secondary | ICD-10-CM

## 2023-03-20 DIAGNOSIS — R5383 Other fatigue: Secondary | ICD-10-CM | POA: Diagnosis not present

## 2023-03-20 MED ORDER — WEGOVY 0.25 MG/0.5ML ~~LOC~~ SOAJ: 0.5000 mg | SUBCUTANEOUS | 2 refills | Status: DC

## 2023-03-20 NOTE — Progress Notes (Signed)
Subjective:    Patient ID: Michaela Campbell, female    DOB: 1985-12-15, 37 y.o.   MRN: [id number]   Chief Complaint: Sleeping during the day, Snoring (Concerned she may have sleep apnea/), Wants to discuss weight loss meds, and Blood In Stools   Pt comes in today with c/o fatigue and falling asleep at times during the day; this has been going on for a while but has become more bothersome. States she does snore and sleeps very lightly. Also c/o seeing some bright red blood when she wipes after having a BM, does have issues with constipation but states it has been worse over the last few weeks.    Patient Active Problem List   Diagnosis Date Noted   GAD (generalized anxiety disorder) 08/27/2020   Migraine 12/19/2016   Birth control 12/19/2016       Review of Systems  Constitutional:  Positive for fatigue. Negative for appetite change and fever.  Respiratory:  Negative for chest tightness and shortness of breath.   Cardiovascular:  Negative for chest pain.  Gastrointestinal:  Positive for constipation. Negative for abdominal pain, nausea and vomiting.  Endocrine: Negative for cold intolerance, heat intolerance, polydipsia and polyuria.  Neurological:  Negative for dizziness and light-headedness.  All other systems reviewed and are negative.      Objective:   Physical Exam Vitals and nursing note reviewed.  Constitutional:      General: She is not in acute distress.    Appearance: She is not ill-appearing.  HENT:     Head: Normocephalic and atraumatic.     Mouth/Throat:     Mouth: Mucous membranes are moist.     Pharynx: Oropharynx is clear.  Eyes:     Conjunctiva/sclera: Conjunctivae normal.     Pupils: Pupils are equal, round, and reactive to light.  Cardiovascular:     Rate and Rhythm: Normal rate and regular rhythm.     Pulses: Normal pulses.     Heart sounds: Normal heart sounds.  Pulmonary:     Effort: Pulmonary effort is normal.     Breath sounds: Normal  breath sounds.  Abdominal:     General: Bowel sounds are normal. There is no distension.     Palpations: Abdomen is soft. There is no mass.     Tenderness: There is no abdominal tenderness. There is no guarding.  Musculoskeletal:        General: Normal range of motion.     Cervical back: Normal range of motion.  Lymphadenopathy:     Cervical: No cervical adenopathy.  Skin:    General: Skin is warm and dry.     Capillary Refill: Capillary refill takes less than 2 seconds.  Neurological:     General: No focal deficit present.     Mental Status: She is alert and oriented to person, place, and time.  Psychiatric:        Mood and Affect: Mood normal.        Behavior: Behavior normal.    BP 131/80   Pulse 79   Temp (!) 96.7 F (35.9 C) (Temporal)   Resp 20   Ht 5\' 8"  (1.727 m)   Wt 276 lb (125.2 kg)   SpO2 97%   BMI 41.97 kg/m         Assessment & Plan:   Michaela Campbell in today with chief complaint of Sleeping during the day, Snoring (Concerned she may have sleep apnea/), Wants to discuss weight loss meds, and  Blood In Stools   1. Fatigue, unspecified type Recent labs reviewed - Ambulatory referral to Sleep Studies  2. Morbid obesity (HCC) Discussed diet and exercise for person with BMI >25 Will recheck weight in 3-6 months Discussed wegovy and zepbound  3. Gynecomastia Discussed breast reduction. Patient is gong to think about it    The above assessment and management plan was discussed with the patient. The patient verbalized understanding of and has agreed to the management plan. Patient is aware to call the clinic if symptoms persist or worsen. Patient is aware when to return to the clinic for a follow-up visit. Patient educated on when it is appropriate to go to the emergency department.   Mary-Margaret Daphine Deutscher, FNP

## 2023-03-23 ENCOUNTER — Other Ambulatory Visit: Payer: Self-pay | Admitting: Nurse Practitioner

## 2023-03-30 ENCOUNTER — Telehealth: Payer: Self-pay

## 2023-03-30 ENCOUNTER — Other Ambulatory Visit (HOSPITAL_COMMUNITY): Payer: Self-pay

## 2023-03-30 NOTE — Telephone Encounter (Signed)
Patient Advocate Encounter   Received notification from [facility] that prior authorization for [facility] is required.   PA submitted on 03/30/2023    Status is currently pending

## 2023-03-31 ENCOUNTER — Other Ambulatory Visit: Payer: Self-pay | Admitting: Nurse Practitioner

## 2023-04-02 ENCOUNTER — Other Ambulatory Visit: Payer: Self-pay | Admitting: Nurse Practitioner

## 2023-04-03 ENCOUNTER — Other Ambulatory Visit: Payer: Self-pay | Admitting: Nurse Practitioner

## 2023-04-03 NOTE — Telephone Encounter (Signed)
Name from pharmacy: WEGOVY 0.25 MG/0.5 ML PEN  Pharmacy comment: Script Clarification:PLEASE CLARIFY DIRECTIONS; SHOULD IT BE 0.5ML (0.25MG ) ONCE WEEKLY OR DID YOU WANT THE WEGOVY 0.5MG /0.5ML PEN? PLEASE CLARIFY DRUG AND DIRECTIONS.

## 2023-04-04 ENCOUNTER — Other Ambulatory Visit: Payer: Self-pay | Admitting: Family Medicine

## 2023-04-04 NOTE — Telephone Encounter (Signed)
  Name from pharmacy: WEGOVY 0.25 MG/0.5 ML PEN    Pharmacy comment: Script Clarification:WEGOVY O.25MG  ONLY COMES IN PACKAGE SIZE WHICH IS 4 DOSES ; IF YOU WANT TO INJECT 0.5MG  PLEASE SEND SCRIPT FOR WEGOVY 0.5MG  OR CHANGE SIG IF YOU WANT TO INJECT 0.25MG  (0.5 ML) TO THAT.

## 2023-04-05 ENCOUNTER — Other Ambulatory Visit: Payer: Self-pay | Admitting: Nurse Practitioner

## 2023-04-05 MED ORDER — WEGOVY 0.25 MG/0.5ML ~~LOC~~ SOAJ: 0.5000 mg | SUBCUTANEOUS | 2 refills | Status: DC

## 2023-04-06 ENCOUNTER — Other Ambulatory Visit: Payer: Self-pay | Admitting: Nurse Practitioner

## 2023-04-06 ENCOUNTER — Other Ambulatory Visit: Payer: Self-pay

## 2023-04-06 MED ORDER — WEGOVY 0.5 MG/0.5ML ~~LOC~~ SOAJ: 0.5000 mg | SUBCUTANEOUS | 2 refills | Status: DC

## 2023-04-06 MED ORDER — WEGOVY 0.25 MG/0.5ML ~~LOC~~ SOAJ: 0.2500 mg | SUBCUTANEOUS | 2 refills | Status: DC

## 2023-04-06 NOTE — Progress Notes (Signed)
Meds ordered this encounter  Medications   Semaglutide-Weight Management (WEGOVY) 0.5 MG/0.5ML SOAJ    Sig: Inject 0.5 mg into the skin once a week.    Dispense:  2 mL    Refill:  2    Order Specific Question:   Supervising Provider    Answer:   Arville Care A [id number]

## 2023-04-06 NOTE — Telephone Encounter (Signed)
Name from pharmacy: WEGOVY 0.25 MG/0.5 ML PEN        Will file in chart as: WEGOVY 0.25 MG/0.5ML SOAJ    Possible duplicate: Hover to review recent actions on this medication   Sig: INJECT 0.5 MG INTO THE SKIN ONE TIME PER WEEK   Disp: Not specified (Pharmacy requested: 2 each)    Refills: 2    Pharmacy comment: Script Clarification:THIS PEN CAN ONLY GIVE 0.25MG  DOSE.

## 2023-04-06 NOTE — Telephone Encounter (Signed)
Contacted pharmacy and corrected directions. Patient notiified

## 2023-04-07 NOTE — Telephone Encounter (Signed)
Patient Advocate Encounter  Prior Authorization for [employer] 0.25MG /0.5ML PEN  has been approved.     Effective: 03-31-2023 to 10-31-2023  All strengths approved

## 2023-04-13 ENCOUNTER — Ambulatory Visit: Payer: PRIVATE HEALTH INSURANCE | Admitting: Nurse Practitioner

## 2023-04-13 ENCOUNTER — Encounter: Payer: Self-pay | Admitting: Nurse Practitioner

## 2023-04-13 VITALS — BP 115/77 | HR 85 | Ht 69.0 in | Wt 269.0 lb

## 2023-04-13 DIAGNOSIS — R0683 Snoring: Secondary | ICD-10-CM

## 2023-04-13 DIAGNOSIS — E669 Obesity, unspecified: Secondary | ICD-10-CM

## 2023-04-13 DIAGNOSIS — G4709 Other insomnia: Secondary | ICD-10-CM | POA: Diagnosis not present

## 2023-04-13 DIAGNOSIS — G4719 Other hypersomnia: Secondary | ICD-10-CM | POA: Diagnosis not present

## 2023-04-13 DIAGNOSIS — G4700 Insomnia, unspecified: Secondary | ICD-10-CM | POA: Insufficient documentation

## 2023-04-13 NOTE — Progress Notes (Signed)
@Patient  ID: Michaela Campbell, female    DOB: 15-Jan-1986, 37 y.o.   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 Past medical history significant for migraines, GAD.   TEST/EVENTS:   04/13/2023: Today - sleep consult Patient presents today for sleep consult, referred by Mary-Margaret Daphine Deutscher, FNP. Husband tells her that she snores. She's never woken up gasping for air. Sometime has trouble falling asleep. No sleep aids. She does have daytime fatigue symptoms. She will doze off when she's sitting still not doing anything. She sometimes has dry mouth when she wakes up. She does get migraines 1-2 times a month but these tend to be around her menstrual cycle otherwise, no morning headaches. She has had trouble with drowsy driving. She did doze off while driving on the way home after working a night shift a few weeks ago. Never had an accident because of this. She has some occasional sleep talking. No sleep walking recently. She did take [place] when she was pregnant around 17 years ago and slept walked but hasn't taken anything since. No sleep paralysis. No history of narcolepsy or symptoms of cataplexy.  Goes to bed between 10 PM to midnight or a.m. to 10 AM depending on which if she worked.  Typically has more trouble with dozing/tiredness after working a night shift.  Falls asleep within 15 to 45 minutes.  Wakes multiple times.  Feels like she is just a light sleeper and always has been.  Officially gets up between 3 AM to 4 AM if she is working dayshift or 3 PM to 4 PM she is working night shift.  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  Weight is up 10 pounds over the last 2 years.  Never had a previous sleep study.  Does not wear oxygen. No history of cardiac disease, stroke or diabetes. She has never smoker.  Drinks 2 alcoholic beverages a week.   No excessive caffeine intake.  Lives with her husband and 4 children.  Family history of allergies and cancer.  Both of her parents do have sleep apnea and are on CPAP therapy.  Epworth 12  No Known Allergies  Immunization History  Administered Date(s) Administered   DTaP 10/01/1984, 02/11/1985, 09/30/1986, 03/07/1988, 06/20/1989   Hepatitis B 07/13/1995, 08/17/1995, 02/08/1996   IPV 10/01/1984, 02/11/1985, 09/30/1986, 06/20/1989   MMR 09/30/1986, 02/23/2007   PFIZER(Purple Top)SARS-COV-2 Vaccination 06/18/2020, 07/23/2020   Td 08/09/2001, 07/16/2009, 08/27/2020   Tdap 07/16/2009    Past Medical History:  Diagnosis Date   Anxiety     Tobacco History: Social History   Tobacco Use  Smoking Status Never  Smokeless Tobacco Never   Counseling given: Not Answered   Outpatient Medications Prior to Visit  Medication Sig Dispense Refill   diclofenac (VOLTAREN) 75 MG EC tablet TAKE 1 TABLET BY MOUTH TWICE A DAY 60 tablet 0   norethindrone-ethinyl estradiol (NORTREL 0.5/35, 28,) 0.5-35 MG-MCG tablet Take 1 tablet by mouth daily. 84 tablet 3   Semaglutide-Weight Management (WEGOVY) 0.5 MG/0.5ML SOAJ Inject 0.5 mg into the skin once a week. 2 mL 2   sertraline (ZOLOFT) 50 MG tablet Take 1 tablet (50 mg total) by mouth daily. 90 tablet 1   SUMAtriptan (IMITREX) 100 MG tablet TAKE 1 TAB EVERY 2 HOURS AS NEEDED FOR MIGRAINE MAY REPEAT IN 2 HOURS IF HEADACHE PERSISTS OR  RECURS 10 tablet 2   Vitamin D, Ergocalciferol, (DRISDOL) 1.25 MG (50000 UNIT) CAPS capsule Take 1 capsule (50,000 Units total) by mouth every 7 (seven) days. 12 capsule 3   phentermine (ADIPEX-P) 37.5 MG tablet TAKE 1 TABLET BY MOUTH DAILY BEFORE BREAKFAST (Patient not taking: Reported on 04/13/2023) 30 tablet 0   No facility-administered medications prior to visit.     Review of Systems:   Constitutional: No night sweats, fevers, chills, or lassitude. +weight gain, daytime fatigue  HEENT: No difficulty swallowing,  tooth/dental problems, or sore throat. No itching, ear ache +migraines, dry mouth, seasonal allergies CV:  No chest pain, orthopnea, PND, swelling in lower extremities, anasarca, dizziness, palpitations, syncope Resp: +Snoring. No shortness of breath with exertion or at rest. No excess mucus or change in color of mucus. No productive or non-productive. No hemoptysis. No wheezing.  No chest wall deformity GI:  No heartburn, indigestion GU: No dysuria, change in color of urine, urgency or frequency.   Skin: No rash, lesions, ulcerations MSK:  No joint pain or swelling.   Neuro: No dizziness or lightheadedness.  Psych: No depression +anxiety (stable). Mood stable. +sleep disturbance, sleep parasomnias    Physical Exam:  BP 115/77   Pulse 85   Ht 5\' 9"  (1.753 m)   Wt 269 lb (122 kg) Comment: pt stated  SpO2 97%   BMI 39.72 kg/m   GEN: Pleasant, interactive, well-appearing; obese; in no acute distress. HEENT:  Normocephalic and atraumatic. PERRLA. Sclera white. Nasal turbinates pink, moist and patent bilaterally. No rhinorrhea present. Oropharynx pink and moist, without exudate or edema. No lesions, ulcerations, or postnasal drip. Mallampati III NECK:  Supple w/ fair ROM. No JVD present. Normal carotid impulses w/o bruits. Thyroid symmetrical with no goiter or nodules palpated. No lymphadenopathy.   CV: RRR, no m/r/g, no peripheral edema. Pulses intact, +2 bilaterally. No cyanosis, pallor or clubbing. PULMONARY:  Unlabored, regular breathing. Clear bilaterally A&P w/o wheezes/rales/rhonchi. No accessory muscle use.  GI: BS present and normoactive. Soft, non-tender to palpation. No organomegaly or masses detected.  MSK: No erythema, warmth or tenderness. Cap refil <2 sec all extrem. No deformities or joint swelling noted.  Neuro: A/Ox3. No focal deficits noted.   Skin: Warm, no lesions or rashe Psych: Normal affect and behavior. Judgement and thought content appropriate.     Lab  Results:  CBC    Component Value Date/Time   WBC 10.0 09/01/2022 1005   RBC 4.26 09/01/2022 1005   HGB 12.3 09/01/2022 1005   HCT 37.1 09/01/2022 1005   PLT 248 09/01/2022 1005   MCV 87 09/01/2022 1005   MCH 28.9 09/01/2022 1005   MCHC 33.2 09/01/2022 1005   RDW 12.5 09/01/2022 1005   LYMPHSABS 2.8 09/01/2022 1005   EOSABS 0.1 09/01/2022 1005   BASOSABS 0.0 09/01/2022 1005    BMET    Component Value Date/Time   NA 137 09/01/2022 1005   K 4.1 09/01/2022 1005   CL 103 09/01/2022 1005   CO2 22 09/01/2022 1005   GLUCOSE 88 09/01/2022 1005   BUN 10 09/01/2022 1005   CREATININE 0.78 09/01/2022 1005   CALCIUM 8.8 09/01/2022 1005   GFRNONAA 94 08/27/2020 1007   GFRAA 108 08/27/2020 1007    BNP No results found for: "BNP"   Imaging:  No results found.        No data to display          No results found for: "NITRICOXIDE"  Assessment & Plan:   Excessive daytime sleepiness She has snoring, excessive daytime sleepiness, restless sleep, drowsy driving. BMI 39. Epworth 12. Given this,  I am concerned she could have sleep disordered breathing with obstructive sleep apnea. She will need sleep study for further evaluation.    - discussed how weight can impact sleep and risk for sleep disordered breathing - discussed options to assist with weight loss: combination of diet modification, cardiovascular and strength training exercises   - had an extensive discussion regarding the adverse health consequences related to untreated sleep disordered breathing - specifically discussed the risks for hypertension, coronary artery disease, cardiac dysrhythmias, cerebrovascular disease, and diabetes - lifestyle modification discussed   - discussed how sleep disruption can increase risk of accidents, particularly when driving - safe driving practices were discussed  Patient Instructions  Given your symptoms, I am concerned that you may have sleep disordered breathing with  sleep apnea. You will need a sleep study for further evaluation. Someone will contact you to schedule this.   We discussed how untreated sleep apnea puts an individual at risk for cardiac arrhthymias, pulm HTN, DM, stroke and increases their risk for daytime accidents. We also briefly reviewed treatment options including weight loss, side sleeping position, oral appliance, CPAP therapy or referral to ENT for possible surgical options  Use caution when driving and pull over if you become sleepy.  Follow up in 6 weeks with Katie Bubber Rothert,NP to go over sleep study results via video visit, or sooner, if needed     Obesity (BMI 30-39.9) BMI 39. Healthy weight loss encouraged.   Insomnia Likely multifactorial related to shift work, anxiety, and possibly untreated OSA. See above plan. Sleep hygiene reviewed. Could consider alternative pharmacological therapy if no significant OSA but would have to be cautious given history of sleep walking in the past with use of ambien.    I spent 35 minutes of dedicated to the care of this patient on the date of this encounter to include pre-visit review of records, face-to-face time with the patient discussing conditions above, post visit ordering of testing, clinical documentation with the electronic health record, making appropriate referrals as documented, and communicating necessary findings to members of the patients care team.  Noemi Chapel, NP 04/13/2023  Pt aware and understands NP's role.

## 2023-04-13 NOTE — Progress Notes (Signed)
Reviewed and agree with assessment/plan.   Coralyn Helling, MD [facility] Pulmonary/Critical Care 04/13/2023, 12:42 PM Pager:  [phone]

## 2023-04-13 NOTE — Patient Instructions (Signed)
Given your symptoms, I am concerned that you may have sleep disordered breathing with sleep apnea. You will need a sleep study for further evaluation. Someone will contact you to schedule this.   We discussed how untreated sleep apnea puts an individual at risk for cardiac arrhthymias, pulm HTN, DM, stroke and increases their risk for daytime accidents. We also briefly reviewed treatment options including weight loss, side sleeping position, oral appliance, CPAP therapy or referral to ENT for possible surgical options  Use caution when driving and pull over if you become sleepy.  Follow up in 6 weeks with Katie Ladd Cen,NP to go over sleep study results via video visit, or sooner, if needed

## 2023-04-13 NOTE — Assessment & Plan Note (Signed)
Likely multifactorial related to shift work, anxiety, and possibly untreated OSA. See above plan. Sleep hygiene reviewed. Could consider alternative pharmacological therapy if no significant OSA but would have to be cautious given history of sleep walking in the past with use of ambien.

## 2023-04-13 NOTE — Assessment & Plan Note (Signed)
BMI 39. Healthy weight loss encouraged.  

## 2023-04-13 NOTE — Assessment & Plan Note (Signed)
She has snoring, excessive daytime sleepiness, restless sleep, drowsy driving. BMI 39. Epworth 12. Given this,  I am concerned she could have sleep disordered breathing with obstructive sleep apnea. She will need sleep study for further evaluation.    - discussed how weight can impact sleep and risk for sleep disordered breathing - discussed options to assist with weight loss: combination of diet modification, cardiovascular and strength training exercises   - had an extensive discussion regarding the adverse health consequences related to untreated sleep disordered breathing - specifically discussed the risks for hypertension, coronary artery disease, cardiac dysrhythmias, cerebrovascular disease, and diabetes - lifestyle modification discussed   - discussed how sleep disruption can increase risk of accidents, particularly when driving - safe driving practices were discussed  Patient Instructions  Given your symptoms, I am concerned that you may have sleep disordered breathing with sleep apnea. You will need a sleep study for further evaluation. Someone will contact you to schedule this.   We discussed how untreated sleep apnea puts an individual at risk for cardiac arrhthymias, pulm HTN, DM, stroke and increases their risk for daytime accidents. We also briefly reviewed treatment options including weight loss, side sleeping position, oral appliance, CPAP therapy or referral to ENT for possible surgical options  Use caution when driving and pull over if you become sleepy.  Follow up in 6 weeks with Michaela Ocean Kearley,NP to go over sleep study results via video visit, or sooner, if needed

## 2023-04-30 ENCOUNTER — Other Ambulatory Visit: Payer: Self-pay | Admitting: Family Medicine

## 2023-05-03 ENCOUNTER — Ambulatory Visit: Payer: 59

## 2023-05-03 DIAGNOSIS — R0683 Snoring: Secondary | ICD-10-CM | POA: Diagnosis not present

## 2023-05-03 DIAGNOSIS — G4719 Other hypersomnia: Secondary | ICD-10-CM

## 2023-05-05 ENCOUNTER — Telehealth: Payer: Self-pay | Admitting: Nurse Practitioner

## 2023-05-05 NOTE — Telephone Encounter (Signed)
Patient aware we have no samples. 

## 2023-05-12 DIAGNOSIS — R0683 Snoring: Secondary | ICD-10-CM | POA: Diagnosis not present

## 2023-05-13 ENCOUNTER — Other Ambulatory Visit (HOSPITAL_COMMUNITY): Payer: Self-pay

## 2023-05-17 ENCOUNTER — Other Ambulatory Visit: Payer: Self-pay | Admitting: Nurse Practitioner

## 2023-05-17 DIAGNOSIS — F411 Generalized anxiety disorder: Secondary | ICD-10-CM

## 2023-06-12 ENCOUNTER — Encounter: Payer: Self-pay | Admitting: Nurse Practitioner

## 2023-06-12 ENCOUNTER — Telehealth (INDEPENDENT_AMBULATORY_CARE_PROVIDER_SITE_OTHER): Payer: No Typology Code available for payment source | Admitting: Nurse Practitioner

## 2023-06-12 DIAGNOSIS — F5101 Primary insomnia: Secondary | ICD-10-CM

## 2023-06-12 DIAGNOSIS — G4700 Insomnia, unspecified: Secondary | ICD-10-CM

## 2023-06-12 DIAGNOSIS — R0683 Snoring: Secondary | ICD-10-CM | POA: Diagnosis not present

## 2023-06-12 DIAGNOSIS — E669 Obesity, unspecified: Secondary | ICD-10-CM | POA: Diagnosis not present

## 2023-06-12 MED ORDER — RAMELTEON 8 MG PO TABS
8.0000 mg | ORAL_TABLET | Freq: Every evening | ORAL | 0 refills | Status: DC | PRN
Start: 2023-06-12 — End: 2023-07-05

## 2023-06-12 NOTE — Assessment & Plan Note (Signed)
Healthy weight loss encouraged 

## 2023-06-12 NOTE — Progress Notes (Signed)
Patient ID: Michaela Campbell, female     DOB: 07/15/1986, 37 y.o.      MRN: [id number]  Chief Complaint  Patient presents with   Follow-up    Hst    Virtual Visit via Video Note  I connected with [facility] [facility] on 06/12/23 at 10:00 AM EDT by a video enabled telemedicine application and verified that I am speaking with the correct person using two identifiers.  Location: Patient: Home Provider: Office   I discussed the limitations of evaluation and management by telemedicine and the availability of in person appointments. The patient expressed understanding and agreed to proceed.  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 Past medical history significant for migraines, GAD.    TEST/EVENTS:  05/03/2023 HST: AHI 3.5/h, SpO2 low 85%  04/13/2023: OV with Cynithia Hakimi NP for sleep consult, referred by Bennie Pierini, FNP. Husband tells her that she snores. She's never woken up gasping for air. Sometime has trouble falling asleep. No sleep aids. She does have daytime fatigue symptoms. She will doze off when she's sitting still not doing anything. She sometimes has dry mouth when she wakes up. She does get migraines 1-2 times a month but these tend to be around her menstrual cycle otherwise, no morning headaches. She has had trouble with drowsy driving. She did doze off while driving on the way home after working a night shift a few weeks ago. Never had an accident because of this. She has some occasional sleep talking. No sleep walking recently. She did take [place] when she was pregnant around 17 years ago and slept walked but hasn't taken anything since. No sleep paralysis. No history of narcolepsy or symptoms of cataplexy.  Goes to bed between 10 PM to midnight or a.m. to 10 AM depending on which if she worked.  Typically has more trouble with dozing/tiredness after working a night shift.  Falls asleep within 15 to 45 minutes.  Wakes multiple times.   Feels like she is just a light sleeper and always has been.  Officially gets up between 3 AM to 4 AM if she is working dayshift or 3 PM to 4 PM she is working night shift.  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  Weight is up 10 pounds over the last 2 years.  Never had a previous sleep study.  Does not wear oxygen. No history of cardiac disease, stroke or diabetes. She has never smoker.  Drinks 2 alcoholic beverages a week.  No excessive caffeine intake.  Lives with her husband and 4 children.  Family history of allergies and cancer.  Both of her parents do have sleep apnea and are on CPAP therapy. Epworth 12  06/12/2023: Today - follow up Patient presents today for follow up after sleep study which did not reveal any significant sleep disordered breathing. She continues to have trouble with disrupted sleep at night. Struggles to fall asleep most nights. Wakes up some nights. She feels unchanged compared to her last visit. She has tried [place] in the past but did not like it. She was pregnant at the time and had on episode of sleep walking.  No recent sleep parasomnias/paralysis.   No Known Allergies Immunization History  Administered Date(s) Administered   DTaP 10/01/1984, 02/11/1985, 09/30/1986, 03/07/1988, 06/20/1989   Hepatitis B 07/13/1995, 08/17/1995, 02/08/1996   IPV 10/01/1984, 02/11/1985, 09/30/1986, 06/20/1989   MMR 09/30/1986, 02/23/2007   PFIZER(Purple Top)SARS-COV-2 Vaccination 06/18/2020, 07/23/2020  Td 08/09/2001, 07/16/2009, 08/27/2020   Tdap 07/16/2009   Past Medical History:  Diagnosis Date   Anxiety     Tobacco History: Social History   Tobacco Use  Smoking Status Never  Smokeless Tobacco Never   Counseling given: Not Answered   Outpatient Medications Prior to Visit  Medication Sig Dispense Refill   diclofenac (VOLTAREN) 75 MG EC tablet TAKE 1 TABLET BY MOUTH TWICE A DAY 60 tablet 1   norethindrone-ethinyl estradiol (NORTREL 0.5/35, 28,) 0.5-35  MG-MCG tablet Take 1 tablet by mouth daily. 84 tablet 3   phentermine (ADIPEX-P) 37.5 MG tablet TAKE 1 TABLET BY MOUTH DAILY BEFORE BREAKFAST 30 tablet 0   Semaglutide-Weight Management (WEGOVY) 0.5 MG/0.5ML SOAJ Inject 0.5 mg into the skin once a week. 2 mL 2   sertraline (ZOLOFT) 50 MG tablet TAKE 1 TABLET BY MOUTH EVERY DAY 90 tablet 1   SUMAtriptan (IMITREX) 100 MG tablet TAKE 1 TAB EVERY 2 HOURS AS NEEDED FOR MIGRAINE MAY REPEAT IN 2 HOURS IF HEADACHE PERSISTS OR RECURS 10 tablet 2   Vitamin D, Ergocalciferol, (DRISDOL) 1.25 MG (50000 UNIT) CAPS capsule Take 1 capsule (50,000 Units total) by mouth every 7 (seven) days. 12 capsule 3   No facility-administered medications prior to visit.     Review of Systems:   Constitutional: No night sweats, fevers, chills, or lassitude. +weight gain, daytime fatigue  HEENT: No difficulty swallowing, tooth/dental problems, or sore throat. No itching, ear ache +migraines, dry mouth, seasonal allergies CV:  No chest pain, orthopnea, PND, swelling in lower extremities, anasarca, dizziness, palpitations, syncope Resp: +Snoring. No shortness of breath with exertion or at rest. No excess mucus or change in color of mucus. No productive or non-productive. No hemoptysis. No wheezing.  No chest wall deformity GI:  No heartburn, indigestion GU: No dysuria, change in color of urine, urgency or frequency.   Skin: No rash, lesions, ulcerations MSK:  No joint pain or swelling.   Neuro: No dizziness or lightheadedness.  Psych: No depression +anxiety (stable). Mood stable. +sleep disturbance, sleep parasomnias  Observations/Objective: Patient is well-developed, well-nourished in no acute distress. A&Ox3. Resting comfortably at home. Unlabored breathing. Speech is clear and coherent with logical content.   Assessment and Plan: Insomnia No evidence of significant sleep disordered breathing contributing to symptoms of insomnia. Discussed sleep hygiene measures. She  has issues with sleep latency and some nights struggles with sleep maintenance. 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 Will trial her on ramelteon. Side effect profile reviewed. Understands to monitor for changes in sleep habits.   Patient Instructions  Your sleep study did not show any significant sleep disordered breathing. You can try snore strips or an over the counter oral appliance/snore guard to see if this helps with your snoring.   Trial ramelteon 8 mg At bedtime as needed for sleep. Do not drive after taking. Take within 30 minutes of intended sleep onset and get into the bed as soon as you take it. You want to aim to have 7-8 hours of time in the bed after you take this so you are not groggy the next morning. Monitor for any changes in sleep habits/activities and stop if these develop  Let me know if this does not help with your sleep and we can try something else  Follow up in 6 weeks with Dr. Vassie Loll or Philis Nettle. If symptoms do not improve or worsen, please contact office  for sooner follow up or seek emergency care.    Snoring See above. She will trial use of over the counter snore guard or strips.   Obesity (BMI 30-39.9) Healthy weight loss encouraged    I discussed the assessment and treatment plan with the patient. The patient was provided an opportunity to ask questions and all were answered. The patient agreed with the plan and demonstrated an understanding of the instructions.   The patient was advised to call back or seek an in-person evaluation if the symptoms worsen or if the condition fails to improve as anticipated.  I provided 31 minutes of non-face-to-face time during this encounter.   Noemi Chapel, NP

## 2023-06-12 NOTE — Progress Notes (Deleted)
@Patient  ID: Michaela Campbell, female    DOB: [birth year], 37 y.o.   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   TEST/EVENTS:   04/13/2023: Today - sleep consult Patient presents today for sleep consult, referred by Mary-Margaret Daphine Deutscher, FNP. Husband tells her that she snores. She's never woken up gasping for air. Sometime has trouble falling asleep. No sleep aids. She does have daytime fatigue symptoms. She will doze off when she's sitting still not doing anything. She sometimes has dry mouth when she wakes up. She does get migraines 1-2 times a month but these tend to be around her menstrual cycle otherwise, no morning headaches. She has had trouble with drowsy driving. She did doze off while driving on the way home after working a night shift a few weeks ago. Never had an accident because of this. She has some occasional sleep talking. No sleep walking recently. She did take [place] when she was pregnant around 17 years ago and slept walked but hasn't taken anything since. No sleep paralysis. No history of narcolepsy or symptoms of cataplexy.  Goes to bed between 10 PM to midnight or a.m. to 10 AM depending on which if she worked.  Typically has more trouble with dozing/tiredness after working a night shift.  Falls asleep within 15 to 45 minutes.  Wakes multiple times.  Feels like she is just a light sleeper and always has been.  Officially gets up between 3 AM to 4 AM if she is working dayshift or 3 PM to 4 PM she is working night shift.  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  Weight is up 10 pounds over the last 2 years.  Never had a previous sleep study.  Does not wear oxygen. No history of cardiac disease, stroke or diabetes. She has never smoker.  Drinks 2 alcoholic beverages  a week.  No excessive caffeine intake.  Lives with her husband and 4 children.  Family history of allergies and cancer.  Both of her parents do have sleep apnea and are on CPAP therapy.  Epworth 12  No Known Allergies  Immunization History  Administered Date(s) Administered   DTaP 10/01/1984, 02/11/1985, 09/30/1986, 03/07/1988, 06/20/1989   Hepatitis B 07/13/1995, 08/17/1995, 02/08/1996   IPV 10/01/1984, 02/11/1985, 09/30/1986, 06/20/1989   MMR 09/30/1986, 02/23/2007   PFIZER(Purple Top)SARS-COV-2 Vaccination 06/18/2020, 07/23/2020   Td 08/09/2001, 07/16/2009, 08/27/2020   Tdap 07/16/2009    Past Medical History:  Diagnosis Date   Anxiety     Tobacco History: Social History   Tobacco Use  Smoking Status Never  Smokeless Tobacco Never   Counseling given: Not Answered   Outpatient Medications Prior to Visit  Medication Sig Dispense Refill   diclofenac (VOLTAREN) 75 MG EC tablet TAKE 1 TABLET BY MOUTH TWICE A DAY 60 tablet 1   norethindrone-ethinyl estradiol (NORTREL 0.5/35, 28,) 0.5-35 MG-MCG tablet Take 1 tablet by mouth daily. 84 tablet 3   phentermine (ADIPEX-P) 37.5 MG tablet TAKE 1 TABLET BY MOUTH DAILY BEFORE BREAKFAST 30 tablet 0   Semaglutide-Weight Management (WEGOVY) 0.5 MG/0.5ML SOAJ Inject 0.5 mg into the skin once a week. 2 mL 2   sertraline (ZOLOFT) 50 MG tablet TAKE 1 TABLET BY MOUTH EVERY DAY 90 tablet 1   SUMAtriptan (IMITREX) 100 MG  tablet TAKE 1 TAB EVERY 2 HOURS AS NEEDED FOR MIGRAINE MAY REPEAT IN 2 HOURS IF HEADACHE PERSISTS OR RECURS 10 tablet 2   Vitamin D, Ergocalciferol, (DRISDOL) 1.25 MG (50000 UNIT) CAPS capsule Take 1 capsule (50,000 Units total) by mouth every 7 (seven) days. 12 capsule 3   No facility-administered medications prior to visit.     Review of Systems:   Constitutional: No night sweats, fevers, chills, or lassitude. +weight gain, daytime fatigue  HEENT: No difficulty swallowing, tooth/dental problems, or sore throat. No  itching, ear ache +migraines, dry mouth, seasonal allergies CV:  No chest pain, orthopnea, PND, swelling in lower extremities, anasarca, dizziness, palpitations, syncope Resp: +Snoring. No shortness of breath with exertion or at rest. No excess mucus or change in color of mucus. No productive or non-productive. No hemoptysis. No wheezing.  No chest wall deformity GI:  No heartburn, indigestion GU: No dysuria, change in color of urine, urgency or frequency.   Skin: No rash, lesions, ulcerations MSK:  No joint pain or swelling.   Neuro: No dizziness or lightheadedness.  Psych: No depression +anxiety (stable). Mood stable. +sleep disturbance, sleep parasomnias    Physical Exam:  There were no vitals taken for this visit.  GEN: Pleasant, interactive, well-appearing; obese; in no acute distress. HEENT:  Normocephalic and atraumatic. PERRLA. Sclera white. Nasal turbinates pink, moist and patent bilaterally. No rhinorrhea present. Oropharynx pink and moist, without exudate or edema. No lesions, ulcerations, or postnasal drip. Mallampati III NECK:  Supple w/ fair ROM. No JVD present. Normal carotid impulses w/o bruits. Thyroid symmetrical with no goiter or nodules palpated. No lymphadenopathy.   CV: RRR, no m/r/g, no peripheral edema. Pulses intact, +2 bilaterally. No cyanosis, pallor or clubbing. PULMONARY:  Unlabored, regular breathing. Clear bilaterally A&P w/o wheezes/rales/rhonchi. No accessory muscle use.  GI: BS present and normoactive. Soft, non-tender to palpation. No organomegaly or masses detected.  MSK: No erythema, warmth or tenderness. Cap refil <2 sec all extrem. No deformities or joint swelling noted.  Neuro: A/Ox3. No focal deficits noted.   Skin: Warm, no lesions or rashe Psych: Normal affect and behavior. Judgement and thought content appropriate.     Lab Results:  CBC    Component Value Date/Time   WBC 10.0 09/01/2022 1005   RBC 4.26 09/01/2022 1005   HGB 12.3  09/01/2022 1005   HCT 37.1 09/01/2022 1005   PLT 248 09/01/2022 1005   MCV 87 09/01/2022 1005   MCH 28.9 09/01/2022 1005   MCHC 33.2 09/01/2022 1005   RDW 12.5 09/01/2022 1005   LYMPHSABS 2.8 09/01/2022 1005   EOSABS 0.1 09/01/2022 1005   BASOSABS 0.0 09/01/2022 1005    BMET    Component Value Date/Time   NA 137 09/01/2022 1005   K 4.1 09/01/2022 1005   CL 103 09/01/2022 1005   CO2 22 09/01/2022 1005   GLUCOSE 88 09/01/2022 1005   BUN 10 09/01/2022 1005   CREATININE 0.78 09/01/2022 1005   CALCIUM 8.8 09/01/2022 1005   GFRNONAA 94 08/27/2020 1007   GFRAA 108 08/27/2020 1007    BNP No results found for: "BNP"   Imaging:  No results found.  Administration History     None           No data to display          No results found for: "NITRICOXIDE"      Assessment & Plan:   No problem-specific Assessment & Plan notes found for this encounter.  I spent 35 minutes of dedicated to the care of this patient on the date of this encounter to include pre-visit review of records, face-to-face time with the patient discussing conditions above, post visit ordering of testing, clinical documentation with the electronic health record, making appropriate referrals as documented, and communicating necessary findings to members of the patients care team.  Noemi Chapel, NP 06/12/2023  Pt aware and understands NP's role.

## 2023-06-12 NOTE — Patient Instructions (Signed)
Your sleep study did not show any significant sleep disordered breathing. You can try snore strips or an over the counter oral appliance/snore guard to see if this helps with your snoring.   Trial ramelteon 8 mg At bedtime as needed for sleep. Do not drive after taking. Take within 30 minutes of intended sleep onset and get into the bed as soon as you take it. You want to aim to have 7-8 hours of time in the bed after you take this so you are not groggy the next morning. Monitor for any changes in sleep habits/activities and stop if these develop  Let me know if this does not help with your sleep and we can try something else  Follow up in 6 weeks with Dr. Vassie Loll or Philis Nettle. If symptoms do not improve or worsen, please contact office for sooner follow up or seek emergency care.

## 2023-06-12 NOTE — Assessment & Plan Note (Signed)
No evidence of significant sleep disordered breathing contributing to symptoms of insomnia. Discussed sleep hygiene measures. She has issues with sleep latency and some nights struggles with sleep maintenance. 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 Will trial her on ramelteon. Side effect profile reviewed. Understands to monitor for changes in sleep habits.   Patient Instructions  Your sleep study did not show any significant sleep disordered breathing. You can try snore strips or an over the counter oral appliance/snore guard to see if this helps with your snoring.   Trial ramelteon 8 mg At bedtime as needed for sleep. Do not drive after taking. Take within 30 minutes of intended sleep onset and get into the bed as soon as you take it. You want to aim to have 7-8 hours of time in the bed after you take this so you are not groggy the next morning. Monitor for any changes in sleep habits/activities and stop if these develop  Let me know if this does not help with your sleep and we can try something else  Follow up in 6 weeks with Dr. Vassie Loll or Philis Nettle. If symptoms do not improve or worsen, please contact office for sooner follow up or seek emergency care.

## 2023-06-12 NOTE — Assessment & Plan Note (Signed)
See above. She will trial use of over the counter snore guard or strips.

## 2023-07-05 ENCOUNTER — Other Ambulatory Visit: Payer: Self-pay | Admitting: Nurse Practitioner

## 2023-07-05 DIAGNOSIS — F5101 Primary insomnia: Secondary | ICD-10-CM

## 2023-07-09 ENCOUNTER — Other Ambulatory Visit: Payer: Self-pay | Admitting: Nurse Practitioner

## 2023-07-30 ENCOUNTER — Other Ambulatory Visit: Payer: Self-pay | Admitting: Nurse Practitioner

## 2023-08-01 ENCOUNTER — Telehealth (INDEPENDENT_AMBULATORY_CARE_PROVIDER_SITE_OTHER): Payer: 59 | Admitting: Nurse Practitioner

## 2023-08-01 ENCOUNTER — Encounter: Payer: Self-pay | Admitting: Nurse Practitioner

## 2023-08-01 DIAGNOSIS — G4700 Insomnia, unspecified: Secondary | ICD-10-CM | POA: Diagnosis not present

## 2023-08-01 DIAGNOSIS — R0683 Snoring: Secondary | ICD-10-CM

## 2023-08-01 DIAGNOSIS — G4709 Other insomnia: Secondary | ICD-10-CM

## 2023-08-01 NOTE — Assessment & Plan Note (Signed)
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 Improved with use of ramelteon. Sleep hygiene reviewed. Will continue current regimen. Aware of safe driving practices. No significant sleep disordered breathing on previous sleep study.  Patient Instructions  Continue ramelteon 8 mg At bedtime as needed for sleep. Do not drive after taking. Take within 30 minutes of intended sleep onset and get into the bed as soon as you take it. You want to aim to have 7-8 hours of time in the bed after you take this so you are not groggy the next morning. Monitor for any changes in sleep habits/activities and stop if these develop  You can try an over the counter snore guard or nasal trumpets to help with snoring    Follow up in 6 months with Dr. Vassie Loll or Philis Nettle. If symptoms do not improve or worsen, please contact office for sooner follow up or seek emergency care.

## 2023-08-01 NOTE — Patient Instructions (Addendum)
Continue ramelteon 8 mg At bedtime as needed for sleep. Do not drive after taking. Take within 30 minutes of intended sleep onset and get into the bed as soon as you take it. You want to aim to have 7-8 hours of time in the bed after you take this so you are not groggy the next morning. Monitor for any changes in sleep habits/activities and stop if these develop  You can try an over the counter snore guard or nasal trumpets to help with snoring    Follow up in 6 months with Dr. Vassie Loll or Philis Nettle. If symptoms do not improve or worsen, please contact office for sooner follow up or seek emergency care.

## 2023-08-01 NOTE — Progress Notes (Signed)
Patient ID: Michaela Campbell, female     DOB: 01-27-1986, 37 y.o.      MRN: [id number]  Chief Complaint  Patient presents with   Follow-up    Pt is here for Primary Insomnia visit. Pt states the nasal strips are not working.    Virtual Visit via Video Note  I connected with Michaela Campbell on 08/01/23 at 10:30 AM EDT by a video enabled telemedicine application and verified that I am speaking with the correct person using two identifiers.  Location: Patient: Home Provider: Office   I discussed the limitations of evaluation and management by telemedicine and the availability of in person appointments. The patient expressed understanding and agreed to proceed.  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 Past medical history significant for migraines, GAD.    TEST/EVENTS:  05/03/2023 HST: AHI 3.5/h, SpO2 low 85%  04/13/2023: OV with Ajee Heasley NP for sleep consult, referred by Bennie Pierini, FNP. Husband tells her that she snores. She's never woken up gasping for air. Sometime has trouble falling asleep. No sleep aids. She does have daytime fatigue symptoms. She will doze off when she's sitting still not doing anything. She sometimes has dry mouth when she wakes up. She does get migraines 1-2 times a month but these tend to be around her menstrual cycle otherwise, no morning headaches. She has had trouble with drowsy driving. She did doze off while driving on the way home after working a night shift a few weeks ago. Never had an accident because of this. She has some occasional sleep talking. No sleep walking recently. She did take [place] when she was pregnant around 17 years ago and slept walked but hasn't taken anything since. No sleep paralysis. No history of narcolepsy or symptoms of cataplexy.  Goes to bed between 10 PM to midnight or a.m. to 10 AM depending on which if she worked.  Typically has more trouble with dozing/tiredness after working a  night shift.  Falls asleep within 15 to 45 minutes.  Wakes multiple times.  Feels like she is just a light sleeper and always has been.  Officially gets up between 3 AM to 4 AM if she is working dayshift or 3 PM to 4 PM she is working night shift.  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  Weight is up 10 pounds over the last 2 years.  Never had a previous sleep study.  Does not wear oxygen. No history of cardiac disease, stroke or diabetes. She has never smoker.  Drinks 2 alcoholic beverages a week.  No excessive caffeine intake.  Lives with her husband and 4 children.  Family history of allergies and cancer.  Both of her parents do have sleep apnea and are on CPAP therapy. [place] 12  06/12/2023: OV with Atiba Kimberlin NP for follow up after sleep study which did not reveal any significant sleep disordered breathing. She continues to have trouble with disrupted sleep at night. Struggles to fall asleep most nights. Wakes up some nights. She feels unchanged compared to her last visit. She has tried [place] in the past but did not like it. She was pregnant at the time and had on episode of sleep walking.  No recent sleep parasomnias/paralysis.   08/01/2023: Today - follow up Patient presents today via virtual visit for follow up. She was started on ramelteon at our last visit. This does help her sleep better at night. She doesn't feel  like it makes her too sleepy to where she can't wake up to things but she feels like it makes her sleep more restful. She just doesn't use it often because she doesn't always have 7-8 hours being in the bed due to her schedule. Tends to wake up feeling more energized when she does take it. She is still snoring some nights, even with snore strips. Being more cautious with driving after night shift. No morning headaches, sleep parasomnias/paralysis.   No Known Allergies Immunization History  Administered Date(s) Administered   DTaP 10/01/1984, 02/11/1985, 09/30/1986,  03/07/1988, 06/20/1989   Hepatitis B 07/13/1995, 08/17/1995, 02/08/1996   IPV 10/01/1984, 02/11/1985, 09/30/1986, 06/20/1989   MMR 09/30/1986, 02/23/2007   PFIZER(Purple Top)SARS-COV-2 Vaccination 06/18/2020, 07/23/2020   Td 08/09/2001, 07/16/2009, 08/27/2020   Tdap 07/16/2009   Past Medical History:  Diagnosis Date   Anxiety     Tobacco History: Social History   Tobacco Use  Smoking Status Never  Smokeless Tobacco Never   Counseling given: Not Answered   Outpatient Medications Prior to Visit  Medication Sig Dispense Refill   diclofenac (VOLTAREN) 75 MG EC tablet TAKE 1 TABLET BY MOUTH TWICE A DAY 60 tablet 1   norethindrone-ethinyl estradiol (NORTREL 0.5/35, 28,) 0.5-35 MG-MCG tablet Take 1 tablet by mouth daily. 84 tablet 3   ramelteon (ROZEREM) 8 MG tablet TAKE 1 TABLET (8 MG TOTAL) BY MOUTH AT BEDTIME AS NEEDED FOR SLEEP. DO NOT DRIVE AFTER TAKING. 90 tablet 1   sertraline (ZOLOFT) 50 MG tablet TAKE 1 TABLET BY MOUTH EVERY DAY 90 tablet 1   SUMAtriptan (IMITREX) 100 MG tablet TAKE 1 TAB EVERY 2 HOURS AS NEEDED FOR MIGRAINE MAY REPEAT IN 2 HOURS IF HEADACHE PERSISTS OR RECURS 10 tablet 2   Vitamin D, Ergocalciferol, (DRISDOL) 1.25 MG (50000 UNIT) CAPS capsule TAKE 1 CAPSULE (50,000 UNITS TOTAL) BY MOUTH EVERY 7 (SEVEN) DAYS 12 capsule 1   phentermine (ADIPEX-P) 37.5 MG tablet TAKE 1 TABLET BY MOUTH DAILY BEFORE BREAKFAST 30 tablet 0   Semaglutide-Weight Management (WEGOVY) 0.5 MG/0.5ML SOAJ Inject 0.5 mg into the skin once a week. 2 mL 2   No facility-administered medications prior to visit.     Review of Systems:   Constitutional: No night sweats, fevers, chills, or lassitude. +weight gain, daytime fatigue  HEENT: No difficulty swallowing, tooth/dental problems, or sore throat. No itching, ear ache +migraines, dry mouth, seasonal allergies CV:  No chest pain, orthopnea, PND, swelling in lower extremities, anasarca, dizziness, palpitations, syncope Resp: +Snoring. No  shortness of breath with exertion or at rest. No excess mucus or change in color of mucus. No productive or non-productive. No hemoptysis. No wheezing.  No chest wall deformity GI:  No heartburn, indigestion GU: No dysuria, change in color of urine, urgency or frequency.   Skin: No rash, lesions, ulcerations MSK:  No joint pain or swelling.   Neuro: No dizziness or lightheadedness.  Psych: No depression +anxiety (stable). Mood stable. +sleep disturbance (improved)  Observations/Objective: Patient is well-developed, well-nourished in no acute distress. A&Ox3. Resting comfortably at home. Unlabored breathing. Speech is clear and coherent with logical content.   Assessment and Plan: Insomnia Likely component of circadian rhythm disorder with shift work. Improved with use of ramelteon. Sleep hygiene reviewed. Will continue current regimen. Aware of safe driving practices. No significant sleep disordered breathing on previous sleep study.  Patient Instructions  Continue ramelteon 8 mg At bedtime as needed for sleep. Do not drive after taking. Take within 30 minutes of intended  sleep onset and get into the bed as soon as you take it. You want to aim to have 7-8 hours of time in the bed after you take this so you are not groggy the next morning. Monitor for any changes in sleep habits/activities and stop if these develop  You can try an over the counter snore guard or nasal trumpets to help with snoring    Follow up in 6 months with Dr. Vassie Loll or Philis Nettle. If symptoms do not improve or worsen, please contact office for sooner follow up or seek emergency care.    Snoring See above     I discussed the assessment and treatment plan with the patient. The patient was provided an opportunity to ask questions and all were answered. The patient agreed with the plan and demonstrated an understanding of the instructions.   The patient was advised to call back or seek an in-person evaluation if the  symptoms worsen or if the condition fails to improve as anticipated.  I provided 22 minutes of non-face-to-face time during this encounter.   Noemi Chapel, NP

## 2023-08-01 NOTE — Assessment & Plan Note (Signed)
See above

## 2023-08-03 ENCOUNTER — Other Ambulatory Visit: Payer: Self-pay | Admitting: Nurse Practitioner

## 2023-08-03 DIAGNOSIS — Z0000 Encounter for general adult medical examination without abnormal findings: Secondary | ICD-10-CM

## 2023-09-04 ENCOUNTER — Other Ambulatory Visit: Payer: Self-pay | Admitting: Nurse Practitioner

## 2023-09-04 ENCOUNTER — Encounter: Payer: Self-pay | Admitting: Nurse Practitioner

## 2023-09-04 ENCOUNTER — Ambulatory Visit (INDEPENDENT_AMBULATORY_CARE_PROVIDER_SITE_OTHER): Payer: 59 | Admitting: Nurse Practitioner

## 2023-09-04 ENCOUNTER — Ambulatory Visit
Admission: RE | Admit: 2023-09-04 | Discharge: 2023-09-04 | Disposition: A | Discharge status: Home / Self Care | Payer: 59 | Source: Ambulatory Visit | Attending: Nurse Practitioner | Admitting: Nurse Practitioner

## 2023-09-04 VITALS — BP 110/64 | HR 71 | Temp 98.3°F | Resp 20 | Ht 69.0 in | Wt 278.0 lb

## 2023-09-04 DIAGNOSIS — E669 Obesity, unspecified: Secondary | ICD-10-CM

## 2023-09-04 DIAGNOSIS — Z0001 Encounter for general adult medical examination with abnormal findings: Secondary | ICD-10-CM

## 2023-09-04 DIAGNOSIS — F411 Generalized anxiety disorder: Secondary | ICD-10-CM | POA: Diagnosis not present

## 2023-09-04 DIAGNOSIS — G4709 Other insomnia: Secondary | ICD-10-CM

## 2023-09-04 DIAGNOSIS — Z1231 Encounter for screening mammogram for malignant neoplasm of breast: Secondary | ICD-10-CM

## 2023-09-04 DIAGNOSIS — Z0000 Encounter for general adult medical examination without abnormal findings: Secondary | ICD-10-CM

## 2023-09-04 DIAGNOSIS — G43809 Other migraine, not intractable, without status migrainosus: Secondary | ICD-10-CM | POA: Diagnosis not present

## 2023-09-04 DIAGNOSIS — R0683 Snoring: Secondary | ICD-10-CM

## 2023-09-04 DIAGNOSIS — G4719 Other hypersomnia: Secondary | ICD-10-CM

## 2023-09-04 DIAGNOSIS — F5101 Primary insomnia: Secondary | ICD-10-CM

## 2023-09-04 MED ORDER — SERTRALINE HCL 50 MG PO TABS: 50.0000 mg | ORAL_TABLET | Freq: Every day | ORAL | 1 refills | Status: DC

## 2023-09-04 MED ORDER — RAMELTEON 8 MG PO TABS
8.0000 mg | ORAL_TABLET | Freq: Every evening | ORAL | 1 refills | Status: DC | PRN
Start: 2023-09-04 — End: 2024-09-05

## 2023-09-04 MED ORDER — PHENTERMINE HCL 37.5 MG PO TABS: 37.5000 mg | ORAL_TABLET | Freq: Every day | ORAL | 3 refills | Status: AC

## 2023-09-04 NOTE — Progress Notes (Signed)
Subjective:    Patient ID: Michaela Campbell, female    DOB: 1986/04/25, 37 y.o.   MRN: [id number]   Chief Complaint: medical management of chronic issues     HPI:  Michaela Campbell is a 37 y.o. who identifies as a female who was assigned female at birth.   Social history: Lives with: husband Work history: Emergency planning/management officer   Comes in today for follow up of the following chronic medical issues:  1. Annual physical exam No pap  2. Other migraine without status migrainosus, not intractable Good . Has not had one in awhile.  3. GAD (generalized anxiety disorder) Is on zoloft and is doing well    09/04/2023    9:34 AM 03/20/2023    9:15 AM 09/01/2022    9:32 AM 08/31/2021    9:31 AM  GAD 7 : Generalized Anxiety Score  Nervous, Anxious, on Edge 1 0 0 0  Control/stop worrying 0 0 0 0  Worry too much - different things 0 0 0 0  Trouble relaxing 0 0 0 0  Restless 0 0 0 0  Easily annoyed or irritable 1 0 0 0  Afraid - awful might happen 0 0 0 0  Total GAD 7 Score 2 0 0 0  Anxiety Difficulty Not difficult at all Not difficult at all Not difficult at all Not difficult at all       09/04/2023    9:34 AM 03/20/2023    9:15 AM 09/01/2022    9:32 AM  Depression screen PHQ 2/9  Decreased Interest 0 0 0  Down, Depressed, Hopeless 0 0 0  PHQ - 2 Score 0 0 0  Altered sleeping 0 1 1  Tired, decreased energy 0 1 1  Change in appetite 0 0 0  Feeling bad or failure about yourself  0 0 0  Trouble concentrating 0 0 0  Moving slowly or fidgety/restless 0 0 0  Suicidal thoughts 0 0 0  PHQ-9 Score 0 2 2  Difficult doing work/chores Not difficult at all Not difficult at all Not difficult at all     4. Other insomnia Is on rozerem and is sleeping better.  5. Excessive daytime sleepiness Due to not sleeping well at night  6. Snoring Had sleep study in July and only had 3 episodes so  they did not give her CPAP.  7. Obesity (BMI 30-39.9) Weight is up 9 lbs Wt Readings from Last 3  Encounters:  09/04/23 278 lb (126.1 kg)  04/13/23 269 lb (122 kg)  03/20/23 276 lb (125.2 kg)   BMI Readings from Last 3 Encounters:  09/04/23 41.05 kg/m  04/13/23 39.72 kg/m  03/20/23 41.97 kg/m      New complaints: None today  No Known Allergies Outpatient Encounter Medications as of 09/04/2023  Medication Sig   diclofenac (VOLTAREN) 75 MG EC tablet TAKE 1 TABLET BY MOUTH TWICE A DAY   norethindrone-ethinyl estradiol (NECON 0.5/35, 28,) 0.5-35 MG-MCG tablet TAKE 1 TABLET BY MOUTH EVERY DAY   ramelteon (ROZEREM) 8 MG tablet TAKE 1 TABLET (8 MG TOTAL) BY MOUTH AT BEDTIME AS NEEDED FOR SLEEP. DO NOT DRIVE AFTER TAKING.   sertraline (ZOLOFT) 50 MG tablet TAKE 1 TABLET BY MOUTH EVERY DAY   SUMAtriptan (IMITREX) 100 MG tablet TAKE 1 TAB EVERY 2 HOURS AS NEEDED FOR MIGRAINE MAY REPEAT IN 2 HOURS IF HEADACHE PERSISTS OR RECURS   Vitamin D, Ergocalciferol, (DRISDOL) 1.25 MG (50000 UNIT) CAPS capsule TAKE 1 CAPSULE (  50,000 UNITS TOTAL) BY MOUTH EVERY 7 (SEVEN) DAYS   No facility-administered encounter medications on file as of 09/04/2023.    Past Surgical History:  Procedure Laterality Date   WISDOM TOOTH EXTRACTION      Family History  Problem Relation Age of Onset   Gout Mother    Cancer Mother        melanoma   Hypertension Mother    Diabetes Father    Hypertension Father    Arthritis Father    Heart attack Maternal Grandmother    Heart attack Maternal Grandfather    Diabetes Paternal Grandmother    Arthritis Paternal Grandmother       Controlled substance contract: n/a    Review of Systems  Constitutional:  Negative for diaphoresis.  Eyes:  Negative for pain.  Respiratory:  Negative for shortness of breath.   Cardiovascular:  Negative for chest pain, palpitations and leg swelling.  Gastrointestinal:  Negative for abdominal pain.  Endocrine: Negative for polydipsia.  Skin:  Negative for rash.  Neurological:  Negative for dizziness, weakness and headaches.   Hematological:  Does not bruise/bleed easily.  All other systems reviewed and are negative.      Objective:   Physical Exam Vitals and nursing note reviewed.  Constitutional:      General: She is not in acute distress.    Appearance: Normal appearance. She is well-developed.  HENT:     Head: Normocephalic.     Right Ear: Tympanic membrane normal.     Left Ear: Tympanic membrane normal.     Nose: Nose normal.     Mouth/Throat:     Mouth: Mucous membranes are moist.  Eyes:     Pupils: Pupils are equal, round, and reactive to light.  Neck:     Vascular: No carotid bruit or JVD.  Cardiovascular:     Rate and Rhythm: Normal rate and regular rhythm.     Heart sounds: Normal heart sounds.  Pulmonary:     Effort: Pulmonary effort is normal. No respiratory distress.     Breath sounds: Normal breath sounds. No wheezing or rales.  Chest:     Chest wall: No tenderness.  Abdominal:     General: Bowel sounds are normal. There is no distension or abdominal bruit.     Palpations: Abdomen is soft. There is no hepatomegaly, splenomegaly, mass or pulsatile mass.     Tenderness: There is no abdominal tenderness.  Musculoskeletal:        General: Normal range of motion.     Cervical back: Normal range of motion and neck supple.  Lymphadenopathy:     Cervical: No cervical adenopathy.  Skin:    General: Skin is warm and dry.  Neurological:     Mental Status: She is alert and oriented to person, place, and time.     Deep Tendon Reflexes: Reflexes are normal and symmetric.  Psychiatric:        Behavior: Behavior normal.        Thought Content: Thought content normal.        Judgment: Judgment normal.    BP 110/64   Pulse 71   Temp 98.3 F (36.8 C) (Temporal)   Resp 20   Ht 5\' 9"  (1.753 m)   Wt 278 lb (126.1 kg)   SpO2 95%   BMI 41.05 kg/m         Assessment & Plan:   [name] comes in today with chief complaint of Annual Exam (No  pap/)   Diagnosis and orders  addressed:  1. Annual physical exam - CBC with Differential/Platelet - CMP14+EGFR - Lipid panel - Thyroid Panel With TSH - VITAMIN D 25 Hydroxy (Vit-D Deficiency, Fractures)  2. Other migraine without status migrainosus, not intractable Avoid caffeine  3. GAD (generalized anxiety disorder) Stress management - sertraline (ZOLOFT) 50 MG tablet; Take 1 tablet (50 mg total) by mouth daily.  Dispense: 90 tablet; Refill: 1  4. Other insomnia - ramelteon (ROZEREM) 8 MG tablet; Take 1 tablet (8 mg total) by mouth at bedtime as needed for sleep. Do not drive after taking.  Dispense: 90 tablet; Refill: 1  5. Excessive daytime sleepiness Bedtime routine  6. Snoring  7. Obesity (BMI 30-39.9) Discussed diet and exercise for person with BMI >25 Will recheck weight in 3-6 months - phentermine (ADIPEX-P) 37.5 MG tablet; Take 1 tablet (37.5 mg total) by mouth daily before breakfast.  Dispense: 30 tablet; Refill: 3    Labs pending Health Maintenance reviewed Diet and exercise encouraged  Follow up plan: 6 months   Mary-Margaret Daphine Deutscher, FNP

## 2023-09-04 NOTE — Patient Instructions (Signed)
Insomnia Insomnia is a sleep disorder that makes it difficult to fall asleep or stay asleep. Insomnia can cause fatigue, low energy, difficulty concentrating, mood swings, and poor performance at work or school. There are three different ways to classify insomnia: Difficulty falling asleep. Difficulty staying asleep. Waking up too early in the morning. Any type of insomnia can be long-term (chronic) or short-term (acute). Both are common. Short-term insomnia usually lasts for 3 months or less. Chronic insomnia occurs at least three times a week for longer than 3 months. What are the causes? Insomnia may be caused by another condition, situation, or substance, such as: Having certain mental health conditions, such as anxiety and depression. Using caffeine, alcohol, tobacco, or drugs. Having gastrointestinal conditions, such as gastroesophageal reflux disease (GERD). Having certain medical conditions. These include: Asthma. Alzheimer's disease. Stroke. Chronic pain. An overactive thyroid gland (hyperthyroidism). Other sleep disorders, such as restless legs syndrome and sleep apnea. Menopause. Sometimes, the cause of insomnia may not be known. What increases the risk? Risk factors for insomnia include: Gender. Females are affected more often than males. Age. Insomnia is more common as people get older. Stress and certain medical and mental health conditions. Lack of exercise. Having an irregular work schedule. This may include working night shifts and traveling between different time zones. What are the signs or symptoms? If you have insomnia, the main symptom is having trouble falling asleep or having trouble staying asleep. This may lead to other symptoms, such as: Feeling tired or having low energy. Feeling nervous about going to sleep. Not feeling rested in the morning. Having trouble concentrating. Feeling irritable, anxious, or depressed. How is this diagnosed? This condition  may be diagnosed based on: Your symptoms and medical history. Your health care provider may ask about: Your sleep habits. Any medical conditions you have. Your mental health. A physical exam. How is this treated? Treatment for insomnia depends on the cause. Treatment may focus on treating an underlying condition that is causing the insomnia. Treatment may also include: Medicines to help you sleep. Counseling or therapy. Lifestyle adjustments to help you sleep better. Follow these instructions at home: Eating and drinking  Limit or avoid alcohol, caffeinated beverages, and products that contain nicotine and tobacco, especially close to bedtime. These can disrupt your sleep. Do not eat a large meal or eat spicy foods right before bedtime. This can lead to digestive discomfort that can make it hard for you to sleep. Sleep habits  Keep a sleep diary to help you and your health care provider figure out what could be causing your insomnia. Write down: When you sleep. When you wake up during the night. How well you sleep and how rested you feel the next day. Any side effects of medicines you are taking. What you eat and drink. Make your bedroom a dark, comfortable place where it is easy to fall asleep. Put up shades or blackout curtains to block light from outside. Use a white noise machine to block noise. Keep the temperature cool. Limit screen use before bedtime. This includes: Not watching TV. Not using your smartphone, tablet, or computer. Stick to a routine that includes going to bed and waking up at the same times every day and night. This can help you fall asleep faster. Consider making a quiet activity, such as reading, part of your nighttime routine. Try to avoid taking naps during the day so that you sleep better at night. Get out of bed if you are still awake after   15 minutes of trying to sleep. Keep the lights down, but try reading or doing a quiet activity. When you feel  sleepy, go back to bed. General instructions Take over-the-counter and prescription medicines only as told by your health care provider. Exercise regularly as told by your health care provider. However, avoid exercising in the hours right before bedtime. Use relaxation techniques to manage stress. Ask your health care provider to suggest some techniques that may work well for you. These may include: Breathing exercises. Routines to release muscle tension. Visualizing peaceful scenes. Make sure that you drive carefully. Do not drive if you feel very sleepy. Keep all follow-up visits. This is important. Contact a health care provider if: You are tired throughout the day. You have trouble in your daily routine due to sleepiness. You continue to have sleep problems, or your sleep problems get worse. Get help right away if: You have thoughts about hurting yourself or someone else. Get help right away if you feel like you may hurt yourself or others, or have thoughts about taking your own life. Go to your nearest emergency room or: Call 911. Call the National Suicide Prevention Lifeline at 1-800-273-8255 or 988. This is open 24 hours a day. Text the Crisis Text Line at 741741. Summary Insomnia is a sleep disorder that makes it difficult to fall asleep or stay asleep. Insomnia can be long-term (chronic) or short-term (acute). Treatment for insomnia depends on the cause. Treatment may focus on treating an underlying condition that is causing the insomnia. Keep a sleep diary to help you and your health care provider figure out what could be causing your insomnia. This information is not intended to replace advice given to you by your health care provider. Make sure you discuss any questions you have with your health care provider. Document Revised: 09/27/2021 Document Reviewed: 09/27/2021 Elsevier Patient Education  2024 [employer]  

## 2023-09-05 LAB — CBC WITH DIFFERENTIAL/PLATELET
Basophils Absolute: 0 10*3/uL (ref 0.0–0.2)
Basos: 0 %
EOS (ABSOLUTE): 0.1 10*3/uL (ref 0.0–0.4)
Eos: 2 %
Hematocrit: 38.2 % (ref 34.0–46.6)
Hemoglobin: 12.1 g/dL (ref 11.1–15.9)
Immature Grans (Abs): 0 10*3/uL (ref 0.0–0.1)
Immature Granulocytes: 0 %
Lymphocytes Absolute: 2.4 10*3/uL (ref 0.7–3.1)
Lymphs: 30 %
MCH: 28.3 pg (ref 26.6–33.0)
MCHC: 31.7 g/dL (ref 31.5–35.7)
MCV: 89 fL (ref 79–97)
Monocytes Absolute: 0.4 10*3/uL (ref 0.1–0.9)
Monocytes: 5 %
Neutrophils Absolute: 5.2 10*3/uL (ref 1.4–7.0)
Neutrophils: 63 %
Platelets: 234 10*3/uL (ref 150–450)
RBC: 4.28 x10E6/uL (ref 3.77–5.28)
RDW: 12.8 % (ref 11.7–15.4)
WBC: 8.2 10*3/uL (ref 3.4–10.8)

## 2023-09-05 LAB — CMP14+EGFR
ALT: 15 [IU]/L (ref 0–32)
AST: 16 IU/L (ref 0–40)
Albumin: 3.6 g/dL — ABNORMAL LOW (ref 3.9–4.9)
Alkaline Phosphatase: 62 [IU]/L (ref 44–121)
BUN/Creatinine Ratio: 13 (ref 9–23)
BUN: 10 mg/dL (ref 6–20)
Bilirubin Total: 0.2 mg/dL (ref 0.0–1.2)
CO2: 23 mmol/L (ref 20–29)
Calcium: 8.8 mg/dL (ref 8.7–10.2)
Chloride: 104 mmol/L (ref 96–106)
Creatinine, Ser: 0.78 mg/dL (ref 0.57–1.00)
Globulin, Total: 3 g/dL (ref 1.5–4.5)
Glucose: 84 mg/dL (ref 70–99)
Potassium: 4.5 mmol/L (ref 3.5–5.2)
Sodium: 138 mmol/L (ref 134–144)
Total Protein: 6.6 g/dL (ref 6.0–8.5)
eGFR: 99 mL/min/{1.73_m2} (ref >59)

## 2023-09-05 LAB — LIPID PANEL
Chol/HDL Ratio: 3.6 ratio (ref 0.0–4.4)
Cholesterol, Total: 189 mg/dL (ref 100–199)
HDL: 52 mg/dL (ref >39)
LDL Chol Calc (NIH): 109 mg/dL — ABNORMAL HIGH (ref 0–99)
Triglycerides: 163 mg/dL — ABNORMAL HIGH (ref 0–149)
VLDL Cholesterol Cal: 28 mg/dL (ref 5–40)

## 2023-09-05 LAB — THYROID PANEL WITH TSH
Free Thyroxine Index: 1.4 (ref 1.2–4.9)
T3 Uptake Ratio: 17 % — ABNORMAL LOW (ref 24–39)
T4, Total: 8 ug/dL (ref 4.5–12.0)
TSH: 1.43 u[IU]/mL (ref 0.450–4.500)

## 2023-09-05 LAB — VITAMIN D 25 HYDROXY (VIT D DEFICIENCY, FRACTURES): Vit D, 25-Hydroxy: 44.9 ng/mL (ref 30.0–100.0)

## 2023-09-09 ENCOUNTER — Other Ambulatory Visit: Payer: Self-pay | Admitting: Nurse Practitioner

## 2023-10-26 ENCOUNTER — Other Ambulatory Visit: Payer: Self-pay | Admitting: Nurse Practitioner

## 2023-10-26 DIAGNOSIS — Z0000 Encounter for general adult medical examination without abnormal findings: Secondary | ICD-10-CM

## 2024-01-21 ENCOUNTER — Other Ambulatory Visit: Payer: Self-pay | Admitting: Nurse Practitioner

## 2024-01-28 ENCOUNTER — Other Ambulatory Visit: Payer: Self-pay | Admitting: Nurse Practitioner

## 2024-02-01 ENCOUNTER — Encounter: Payer: Self-pay | Admitting: Nurse Practitioner

## 2024-02-01 ENCOUNTER — Ambulatory Visit: Admitting: Nurse Practitioner

## 2024-02-01 VITALS — BP 130/84 | HR 84 | Temp 97.6°F | Ht 69.0 in | Wt 299.0 lb

## 2024-02-01 DIAGNOSIS — J0101 Acute recurrent maxillary sinusitis: Secondary | ICD-10-CM | POA: Diagnosis not present

## 2024-02-01 DIAGNOSIS — R0981 Nasal congestion: Secondary | ICD-10-CM | POA: Diagnosis not present

## 2024-02-01 MED ORDER — FLUTICASONE PROPIONATE 50 MCG/ACT NA SUSP: 2.0000 | Freq: Every day | NASAL | 6 refills | Status: AC

## 2024-02-01 MED ORDER — FLUCONAZOLE 150 MG PO TABS
ORAL_TABLET | ORAL | 0 refills | Status: DC
Start: 2024-02-01 — End: 2024-09-05

## 2024-02-01 MED ORDER — AMOXICILLIN-POT CLAVULANATE 875-125 MG PO TABS: 1.0000 | ORAL_TABLET | Freq: Two times a day (BID) | ORAL | 0 refills | Status: DC

## 2024-02-01 NOTE — Progress Notes (Signed)
   Subjective:    Patient ID: Michaela Campbell, female    DOB: 1986-05-29, 38 y.o.   MRN: [id number]   Chief Complaint: sinusitis  Sinusitis This is a new problem. The current episode started in the past 7 days. The problem has been waxing and waning since onset. There has been no fever. Associated symptoms include congestion, coughing and sinus pressure. Pertinent negatives include no chills. Past treatments include acetaminophen and nasal decongestants. The treatment provided mild relief.    Patient Active Problem List   Diagnosis Date Noted   Snoring 06/12/2023   Excessive daytime sleepiness 04/13/2023   Obesity (BMI 30-39.9) 04/13/2023   Insomnia 04/13/2023   GAD (generalized anxiety disorder) 08/27/2020   Migraine 12/19/2016   Birth control 12/19/2016       Review of Systems  Constitutional:  Negative for chills and fever.  HENT:  Positive for congestion and sinus pressure.   Respiratory:  Positive for cough.        Objective:   Physical Exam Constitutional:      Appearance: Normal appearance. She is obese.  Cardiovascular:     Rate and Rhythm: Normal rate and regular rhythm.     Heart sounds: Normal heart sounds.  Pulmonary:     Breath sounds: Normal breath sounds.  Skin:    General: Skin is warm.  Neurological:     General: No focal deficit present.     Mental Status: She is alert and oriented to person, place, and time.  Psychiatric:        Mood and Affect: Mood normal.        Behavior: Behavior normal.    BP 130/84   Pulse 84   Temp 97.6 F (36.4 C) (Temporal)   Ht 5\' 9"  (1.753 m)   Wt 299 lb (135.6 kg)   BMI 44.15 kg/m         Assessment & Plan:   Michaela Campbell in today with chief complaint of No chief complaint on file.   1. Nasal congestion (Primary)  - Veritor Flu A/B Waived  2. Acute recurrent maxillary sinusitis 1. Take meds as prescribed 2. Use a cool mist humidifier especially during the winter months and when heat has been  humid. 3. Use saline nose sprays frequently 4. Saline irrigations of the nose can be very helpful if done frequently.  * 4X daily for 1 week*  * Use of a nettie pot can be helpful with this. Follow directions with this* 5. Drink plenty of fluids 6. Keep thermostat turn down low 7.For any cough or congestion- mucinex if needed 8. For fever or aces or pains- take tylenol or ibuprofen appropriate for age and weight.  * for fevers greater than 101 orally you may alternate ibuprofen and tylenol every  3 hours.    - amoxicillin-clavulanate (AUGMENTIN) 875-125 MG tablet; Take 1 tablet by mouth 2 (two) times daily.  Dispense: 14 tablet; Refill: 0 - fluconazole (DIFLUCAN) 150 MG tablet; 1 po q week x 4 weeks  Dispense: 4 tablet; Refill: 0    The above assessment and management plan was discussed with the patient. The patient verbalized understanding of and has agreed to the management plan. Patient is aware to call the clinic if symptoms persist or worsen. Patient is aware when to return to the clinic for a follow-up visit. Patient educated on when it is appropriate to go to the emergency department.   Mary-Margaret Daphine Deutscher, FNP

## 2024-02-01 NOTE — Patient Instructions (Signed)

## 2024-02-02 LAB — VERITOR FLU A/B WAIVED
Influenza A: NEGATIVE
Influenza B: NEGATIVE

## 2024-03-29 ENCOUNTER — Other Ambulatory Visit: Payer: Self-pay | Admitting: Nurse Practitioner

## 2024-04-23 ENCOUNTER — Other Ambulatory Visit: Payer: Self-pay | Admitting: Nurse Practitioner

## 2024-04-23 DIAGNOSIS — E669 Obesity, unspecified: Secondary | ICD-10-CM

## 2024-07-11 ENCOUNTER — Other Ambulatory Visit: Payer: Self-pay | Admitting: Nurse Practitioner

## 2024-07-11 DIAGNOSIS — F411 Generalized anxiety disorder: Secondary | ICD-10-CM

## 2024-07-14 DIAGNOSIS — J029 Acute pharyngitis, unspecified: Secondary | ICD-10-CM | POA: Diagnosis not present

## 2024-07-14 DIAGNOSIS — J018 Other acute sinusitis: Secondary | ICD-10-CM | POA: Diagnosis not present

## 2024-07-16 ENCOUNTER — Other Ambulatory Visit: Payer: Self-pay | Admitting: Nurse Practitioner

## 2024-09-05 ENCOUNTER — Other Ambulatory Visit (HOSPITAL_COMMUNITY): Payer: Self-pay

## 2024-09-05 ENCOUNTER — Ambulatory Visit (INDEPENDENT_AMBULATORY_CARE_PROVIDER_SITE_OTHER): Payer: 59 | Admitting: Nurse Practitioner

## 2024-09-05 ENCOUNTER — Telehealth: Payer: Self-pay

## 2024-09-05 ENCOUNTER — Encounter: Payer: Self-pay | Admitting: Nurse Practitioner

## 2024-09-05 VITALS — BP 100/69 | HR 75 | Temp 98.0°F | Ht 69.0 in | Wt 289.0 lb

## 2024-09-05 DIAGNOSIS — G4709 Other insomnia: Secondary | ICD-10-CM

## 2024-09-05 DIAGNOSIS — F411 Generalized anxiety disorder: Secondary | ICD-10-CM

## 2024-09-05 DIAGNOSIS — G43809 Other migraine, not intractable, without status migrainosus: Secondary | ICD-10-CM | POA: Diagnosis not present

## 2024-09-05 DIAGNOSIS — Z0001 Encounter for general adult medical examination with abnormal findings: Secondary | ICD-10-CM

## 2024-09-05 DIAGNOSIS — Z0000 Encounter for general adult medical examination without abnormal findings: Secondary | ICD-10-CM

## 2024-09-05 DIAGNOSIS — R0683 Snoring: Secondary | ICD-10-CM

## 2024-09-05 DIAGNOSIS — F5101 Primary insomnia: Secondary | ICD-10-CM

## 2024-09-05 DIAGNOSIS — E669 Obesity, unspecified: Secondary | ICD-10-CM

## 2024-09-05 DIAGNOSIS — G4719 Other hypersomnia: Secondary | ICD-10-CM

## 2024-09-05 LAB — LIPID PANEL

## 2024-09-05 MED ORDER — SERTRALINE HCL 50 MG PO TABS: 50.0000 mg | ORAL_TABLET | Freq: Every day | ORAL | 1 refills | Status: AC

## 2024-09-05 MED ORDER — RAMELTEON 8 MG PO TABS: 8.0000 mg | ORAL_TABLET | Freq: Every evening | ORAL | 1 refills | Status: AC | PRN

## 2024-09-05 MED ORDER — NECON 0.5/35 (28) 0.5-35 MG-MCG PO TABS: 1.0000 | ORAL_TABLET | Freq: Every day | ORAL | 3 refills | Status: AC

## 2024-09-05 NOTE — Progress Notes (Signed)
 Subjective:    Patient ID: Michaela Campbell, female    DOB: [birth year], 38 y.o.   MRN: [id number]   Chief Complaint: medical management of chronic issues     HPI:  Michaela Campbell is a 38 y.o. who identifies as a female who was assigned female at birth.   Social history: Lives with: husband Work history: emergency planning/management officer   Comes in today for follow up of the following chronic medical issues:  1. Annual physical exam No pap  2. Other migraine without status migrainosus, not intractable Good . Has not had one in awhile.  3. GAD (generalized anxiety disorder) Is on zoloft  and is doing well    09/04/2023    9:34 AM 03/20/2023    9:15 AM 09/01/2022    9:32 AM 08/31/2021    9:31 AM  GAD 7 : Generalized Anxiety Score  Nervous, Anxious, on Edge 1 0 0 0  Control/stop worrying 0 0 0 0  Worry too much - different things 0 0 0 0  Trouble relaxing 0 0 0 0  Restless 0 0 0 0  Easily annoyed or irritable 1 0 0 0  Afraid - awful might happen 0 0 0 0  Total GAD 7 Score 2 0 0 0  Anxiety Difficulty Not difficult at all Not difficult at all Not difficult at all Not difficult at all       09/04/2023    9:34 AM 03/20/2023    9:15 AM 09/01/2022    9:32 AM  Depression screen PHQ 2/9  Decreased Interest 0 0 0  Down, Depressed, Hopeless 0 0 0  PHQ - 2 Score 0 0 0  Altered sleeping 0 1 1  Tired, decreased energy 0 1 1  Change in appetite 0 0 0  Feeling bad or failure about yourself  0 0 0  Trouble concentrating 0 0 0  Moving slowly or fidgety/restless 0 0 0  Suicidal thoughts 0 0 0  PHQ-9 Score 0 2 2  Difficult doing work/chores Not difficult at all Not difficult at all Not difficult at all     4. Other insomnia Is on rozerem  and is sleeping better.  5. Excessive daytime sleepiness Due to not sleeping well at night  6. Snoring Had sleep study in July and only had 3 episodes so  they did not give her CPAP.  7. Obesity (BMI 30-39.9) Patient has been on adipex off and on.  Weight is 11lbs  Wt Readings from Last 3 Encounters:  09/05/24 289 lb (131.1 kg)  02/01/24 299 lb (135.6 kg)  09/04/23 278 lb (126.1 kg)   BMI Readings from Last 3 Encounters:  09/05/24 42.68 kg/m  02/01/24 44.15 kg/m  09/04/23 41.05 kg/m     New complaints: None today  No Known Allergies Outpatient Encounter Medications as of 09/05/2024  Medication Sig   diclofenac  (VOLTAREN ) 75 MG EC tablet TAKE 1 TABLET BY MOUTH TWICE A DAY   fluticasone  (FLONASE ) 50 MCG/ACT nasal spray Place 2 sprays into both nostrils daily.   norethindrone -ethinyl estradiol  (NECON 0.5/35, 28,) 0.5-35 MG-MCG tablet TAKE 1 TABLET BY MOUTH EVERY DAY   phentermine  (ADIPEX-P ) 37.5 MG tablet Take 1 tablet (37.5 mg total) by mouth daily before breakfast.   ramelteon  (ROZEREM ) 8 MG tablet Take 1 tablet (8 mg total) by mouth at bedtime as needed for sleep. Do not drive after taking.   sertraline  (ZOLOFT ) 50 MG tablet TAKE 1 TABLET BY MOUTH EVERY DAY   SUMAtriptan  (  IMITREX ) 100 MG tablet TAKE 1 TAB EVERY 2 HOURS AS NEEDED FOR MIGRAINE MAY REPEAT IN 2 HOURS IF HEADACHE PERSISTS OR RECURS   Vitamin D , Ergocalciferol , (DRISDOL ) 1.25 MG (50000 UNIT) CAPS capsule TAKE 1 CAPSULE (50,000 UNITS TOTAL) BY MOUTH EVERY 7 (SEVEN) DAYS   [DISCONTINUED] amoxicillin -clavulanate (AUGMENTIN ) 875-125 MG tablet Take 1 tablet by mouth 2 (two) times daily.   [DISCONTINUED] fluconazole  (DIFLUCAN ) 150 MG tablet 1 po q week x 4 weeks   No facility-administered encounter medications on file as of 09/05/2024.    Past Surgical History:  Procedure Laterality Date   WISDOM TOOTH EXTRACTION      Family History  Problem Relation Age of Onset   Gout Mother    Cancer Mother        melanoma   Hypertension Mother    Diabetes Father    Hypertension Father    Arthritis Father    Breast cancer Paternal Aunt    Heart attack Maternal Grandmother    Heart attack Maternal Grandfather    Diabetes Paternal Grandmother    Arthritis Paternal  Grandmother       Controlled substance contract: n/a    Review of Systems  Constitutional:  Negative for diaphoresis.  Eyes:  Negative for pain.  Respiratory:  Negative for shortness of breath.   Cardiovascular:  Negative for chest pain, palpitations and leg swelling.  Gastrointestinal:  Negative for abdominal pain.  Endocrine: Negative for polydipsia.  Skin:  Negative for rash.  Neurological:  Negative for dizziness, weakness and headaches.  Hematological:  Does not bruise/bleed easily.  All other systems reviewed and are negative.      Objective:   Physical Exam Vitals and nursing note reviewed.  Constitutional:      General: She is not in acute distress.    Appearance: Normal appearance. She is well-developed.  HENT:     Head: Normocephalic.     Right Ear: Tympanic membrane normal.     Left Ear: Tympanic membrane normal.     Nose: Nose normal.     Mouth/Throat:     Mouth: Mucous membranes are moist.  Eyes:     Pupils: Pupils are equal, round, and reactive to light.  Neck:     Vascular: No carotid bruit or JVD.  Cardiovascular:     Rate and Rhythm: Normal rate and regular rhythm.     Heart sounds: Normal heart sounds.  Pulmonary:     Effort: Pulmonary effort is normal. No respiratory distress.     Breath sounds: Normal breath sounds. No wheezing or rales.  Chest:     Chest wall: No tenderness.  Abdominal:     General: Bowel sounds are normal. There is no distension or abdominal bruit.     Palpations: Abdomen is soft. There is no hepatomegaly, splenomegaly, mass or pulsatile mass.     Tenderness: There is no abdominal tenderness.  Musculoskeletal:        General: Normal range of motion.     Cervical back: Normal range of motion and neck supple.  Lymphadenopathy:     Cervical: No cervical adenopathy.  Skin:    General: Skin is warm and dry.  Neurological:     Mental Status: She is alert and oriented to person, place, and time.     Deep Tendon Reflexes:  Reflexes are normal and symmetric.  Psychiatric:        Behavior: Behavior normal.        Thought Content: Thought content normal.  Judgment: Judgment normal.    BP 100/69   Pulse 75   Temp 98 F (36.7 C) (Temporal)   Ht 5' 9 (1.753 m)   Wt 289 lb (131.1 kg)   SpO2 96%   BMI 42.68 kg/m        Assessment & Plan:   Michaela Campbell comes in today with chief complaint of medical management of chronic issues    Diagnosis and orders addressed:  1. Annual physical exam - CBC with Differential/Platelet - CMP14+EGFR - Lipid panel - Thyroid  Panel With TSH - VITAMIN D  25 Hydroxy (Vit-D Deficiency, Fractures)  2. Other migraine without status migrainosus, not intractable Avoid caffeine  3. GAD (generalized anxiety disorder) Stress management - sertraline  (ZOLOFT ) 50 MG tablet; Take 1 tablet (50 mg total) by mouth daily.  Dispense: 90 tablet; Refill: 1  4. Other insomnia - ramelteon  (ROZEREM ) 8 MG tablet; Take 1 tablet (8 mg total) by mouth at bedtime as needed for sleep. Do not drive after taking.  Dispense: 90 tablet; Refill: 1  5. Excessive daytime sleepiness Bedtime routine  6. Snoring  7. Obesity (BMI 30-39.9) Discussed diet and exercise for person with BMI >25 Will recheck weight in 3-6 months - phentermine  (ADIPEX-P ) 37.5 MG tablet; Take 1 tablet (37.5 mg total) by mouth daily before breakfast.  Dispense: 30 tablet; Refill: 3  Discussed breast reduction Discussed [place] [facility]  Labs pending Health Maintenance reviewed Diet and exercise encouraged  Follow up plan: 6 months   Mary-Margaret Gladis, FNP

## 2024-09-05 NOTE — Patient Instructions (Signed)
Breast Reduction Breast reduction, also called reduction mammoplasty, is surgery to reduce the size of the breasts by removing fat, tissue, and excess skin. The goal of this procedure is to help relieve problems that are caused or made worse by large breasts, such as: Poor posture or difficulty exercising. Long-term (chronic) back and neck pain. A rash under the breasts. Breast pain. Grooves in the shoulders from bra straps. Difficulty with hygiene. Psychological distress caused by large breasts. Before the procedure, you and your surgeon will decide on a plan for the new size and shape of your breasts. Tell your health care provider about: Any allergies you have. All medicines you are taking, including vitamins, herbs, eye drops, creams, and over-the-counter medicines. Any problems you or family members have had with anesthetic medicines. Any bleeding problems you have. Any surgeries you have had. Any medical conditions you have. Whether you are pregnant or may be pregnant. What are the risks? Your health care provider will talk with you about risks. These may include: Infection. Bleeding. Blood may pool near the incision area (hematoma). Allergic reactions to medicines. Damage to nearby structures or organs, such as the dark area around the nipple (areola). Partial or total numbness in the nipple or breast. Sometimes feeling returns, but not always. Inability to breastfeed. Pneumonia. Blood clots. What happens before the procedure? Eating and drinking restrictions Follow instructions from your health care provider about what you may eat and drink. These may include: 8 hours before your procedure Stop eating most foods. Do not eat meat, fried foods, or fatty foods. Eat only light foods, such as toast or crackers. All liquids are okay except energy drinks and alcohol. 6 hours before your procedure Stop eating. Drink only clear liquids, such as water, clear fruit juice, black  coffee, plain tea, and sports drinks. Do not drink energy drinks or alcohol. 2 hours before your procedure Stop drinking all liquids. You may be allowed to take medicines with small sips of water. If you do not follow your health care provider's instructions, your procedure may be delayed or canceled. Medicines Ask your health care provider about: Changing or stopping your regular medicines. These include any diabetes medicines or blood thinners you take. Taking medicines such as aspirin and ibuprofen. These medicines can thin your blood. Do not take them unless your health care provider tells you to. Taking other over-the-counter medicines, vitamins, herbs, and supplements. Tests You may have tests or exams, such as: Blood tests. An X-ray exam of the breasts (mammogram). General instructions Ask your health care provider: How your surgery site will be marked. What steps will be taken to help prevent infection. These may include: Washing skin with a soap that kills germs. Taking antibiotics. Do not use any products that contain nicotine or tobacco for at least 4 weeks before the procedure. These products include cigarettes, chewing tobacco, and vaping devices, such as e-cigarettes. If you need help quitting, ask your health care provider. If you will be going home right after the procedure, plan to have a responsible adult: Take you home from the hospital or clinic. You will not be allowed to drive. Care for you for the time you are told. What happens during the procedure? An IV will be inserted into one of your veins. You may be given: A sedative. This helps you relax. Anesthesia. This will: Numb certain areas of your body. Make you fall asleep for surgery. An incision will be made in each breast. Fat, tissue, and excess skin  will be removed from each breast. Your breasts will be reshaped. Your nipples may be moved so they are centered on your smaller breasts. Tubes may be placed  in your breast tissue to drain fluid from your surgical area. Your incisions will be closed with stitches (sutures) and skin glue or adhesive tapes. Your breasts will be covered with gauze and elastic bandages (dressings). You may have a special compression bra placed over your dressings. The procedure may vary among health care providers and hospitals. What happens after the procedure? Your blood pressure, heart rate, breathing rate, and blood oxygen level will be monitored until you leave the hospital or clinic. You may continue to receive fluids and medicines through an IV. You may have to wear compression stockings. These stockings help to prevent blood clots and reduce swelling in your legs. You may continue to have tubes draining fluid from your surgical area. The tubes will be removed 2-3 days after the surgery. Summary Breast reduction, also called reduction mammoplasty, is surgery to reduce the size of the breasts by removing fat, tissue, and excess skin. Before the procedure, you and your surgeon will decide on a plan for the new size and shape of your breasts. Tubes may be placed in your breast tissue to drain fluid from your surgical area. The tubes will be removed 2-3 days after the surgery. Your incisions will be closed with stitches (sutures) and skin glue or adhesive tapes. Plan to have a responsible adult take you home from the hospital or clinic. This information is not intended to replace advice given to you by your health care provider. Make sure you discuss any questions you have with your health care provider. Document Revised: 01/21/2022 Document Reviewed: 01/21/2022 Elsevier Patient Education  2024 [employer].

## 2024-09-05 NOTE — Telephone Encounter (Signed)
 Pharmacy Patient Advocate Encounter   Received notification from Onbase that prior authorization for Ramelteon  8MG  tablets  is required/requested.   Insurance verification completed.   The patient is insured through [facility].   Per test claim: PA required; PA submitted to above mentioned insurance via Latent Key/confirmation #/EOC [id number] Status is pending

## 2024-09-06 ENCOUNTER — Other Ambulatory Visit (HOSPITAL_COMMUNITY): Payer: Self-pay

## 2024-09-06 ENCOUNTER — Ambulatory Visit: Payer: Self-pay | Admitting: Nurse Practitioner

## 2024-09-06 LAB — CMP14+EGFR
ALT: 10 IU/L (ref 0–32)
AST: 11 IU/L (ref 0–40)
Albumin: 3.9 g/dL (ref 3.9–4.9)
Alkaline Phosphatase: 61 IU/L (ref 41–116)
BUN/Creatinine Ratio: 10 (ref 9–23)
BUN: 8 mg/dL (ref 6–24)
Bilirubin Total: 0.3 mg/dL (ref 0.0–1.2)
CO2: 21 mmol/L (ref 20–29)
Calcium: 8.6 mg/dL — AB (ref 8.7–10.2)
Chloride: 103 mmol/L (ref 96–106)
Creatinine, Ser: 0.81 mg/dL (ref 0.57–1.00)
Globulin, Total: 2.7 g/dL (ref 1.5–4.5)
Glucose: 85 mg/dL (ref 70–99)
Potassium: 4.3 mmol/L (ref 3.5–5.2)
Sodium: 136 mmol/L (ref 134–144)
Total Protein: 6.6 g/dL (ref 6.0–8.5)
eGFR: 94 mL/min/1.73 (ref >59)

## 2024-09-06 LAB — CBC WITH DIFFERENTIAL/PLATELET
Basophils Absolute: 0 x10E3/uL (ref 0.0–0.2)
Basos: 0 %
EOS (ABSOLUTE): 0.1 x10E3/uL (ref 0.0–0.4)
Eos: 1 %
Hematocrit: 37.6 % (ref 34.0–46.6)
Hemoglobin: 12 g/dL (ref 11.1–15.9)
Immature Grans (Abs): 0 x10E3/uL (ref 0.0–0.1)
Immature Granulocytes: 0 %
Lymphocytes Absolute: 2.1 x10E3/uL (ref 0.7–3.1)
Lymphs: 28 %
MCH: 28.1 pg (ref 26.6–33.0)
MCHC: 31.9 g/dL (ref 31.5–35.7)
MCV: 88 fL (ref 79–97)
Monocytes Absolute: 0.4 x10E3/uL (ref 0.1–0.9)
Monocytes: 5 %
Neutrophils Absolute: 5 x10E3/uL (ref 1.4–7.0)
Neutrophils: 66 %
Platelets: 281 x10E3/uL (ref 150–450)
RBC: 4.27 x10E6/uL (ref 3.77–5.28)
RDW: 12.5 % (ref 11.7–15.4)
WBC: 7.6 x10E3/uL (ref 3.4–10.8)

## 2024-09-06 LAB — THYROID PANEL WITH TSH
Free Thyroxine Index: 1.9 (ref 1.2–4.9)
T3 Uptake Ratio: 20 % — AB (ref 24–39)
T4, Total: 9.7 ug/dL (ref 4.5–12.0)
TSH: 1.79 u[IU]/mL (ref 0.450–4.500)

## 2024-09-06 LAB — LIPID PANEL
Cholesterol, Total: 189 mg/dL (ref 100–199)
HDL: 59 mg/dL (ref >39)
LDL CALC COMMENT:: 3.2 ratio (ref 0.0–4.4)
LDL Chol Calc (NIH): 111 mg/dL — AB (ref 0–99)
Triglycerides: 106 mg/dL (ref 0–149)
VLDL Cholesterol Cal: 19 mg/dL (ref 5–40)

## 2024-09-06 LAB — VITAMIN D 25 HYDROXY (VIT D DEFICIENCY, FRACTURES): Vit D, 25-Hydroxy: 44 ng/mL (ref 30.0–100.0)

## 2024-09-06 NOTE — Telephone Encounter (Signed)
 Patient states that she has a decent supply of remeron as she only takes it as needed. She says she tried ambien before and it dd not go well. She declines ambien.

## 2024-09-06 NOTE — Telephone Encounter (Signed)
 Pharmacy Patient Advocate Encounter   Received notification from CoverMyMeds that prior authorization for Ramelteon  8MG  tablets is required/requested.   Insurance verification completed.   The patient is insured through [facility].   Per test claim: Per test claim, medication is not covered due to plan/benefit exclusion, PA not submitted at this time

## 2024-09-06 NOTE — Telephone Encounter (Signed)
 Please review

## 2024-09-06 NOTE — Telephone Encounter (Signed)
 Please let patient ins will not pay for remeron anymore. Would she like to try ambien for sleep?

## 2025-03-06 ENCOUNTER — Ambulatory Visit: Admitting: Nurse Practitioner

## 2025-09-08 ENCOUNTER — Encounter: Admitting: Nurse Practitioner
